# Patient Record
Sex: Male | Born: 1950 | Race: Asian | Hispanic: No | Marital: Married | Smoking: Former smoker
Health system: Southern US, Community
[De-identification: ages and names within clinical notes are randomized; demographics above are authoritative.]

## PROBLEM LIST (undated history)

## (undated) DIAGNOSIS — I10 Essential (primary) hypertension: Secondary | ICD-10-CM

## (undated) DIAGNOSIS — H409 Unspecified glaucoma: Secondary | ICD-10-CM

## (undated) HISTORY — DX: Unspecified glaucoma: H40.9

## (undated) HISTORY — DX: Essential (primary) hypertension: I10

---

## 1998-07-13 ENCOUNTER — Ambulatory Visit (HOSPITAL_COMMUNITY): Admission: RE | Admit: 1998-07-13 | Discharge: 1998-07-13 | Payer: Self-pay | Admitting: Family Medicine

## 1998-07-13 ENCOUNTER — Encounter: Payer: Self-pay | Admitting: Family Medicine

## 2001-06-06 ENCOUNTER — Encounter (INDEPENDENT_AMBULATORY_CARE_PROVIDER_SITE_OTHER): Payer: Self-pay

## 2001-06-06 ENCOUNTER — Ambulatory Visit (HOSPITAL_COMMUNITY): Admission: RE | Admit: 2001-06-06 | Discharge: 2001-06-06 | Payer: Self-pay | Admitting: *Deleted

## 2004-05-04 ENCOUNTER — Encounter: Admission: RE | Admit: 2004-05-04 | Discharge: 2004-05-04 | Payer: Self-pay | Admitting: Internal Medicine

## 2005-05-17 ENCOUNTER — Ambulatory Visit (HOSPITAL_COMMUNITY): Admission: RE | Admit: 2005-05-17 | Discharge: 2005-05-17 | Payer: Self-pay | Admitting: *Deleted

## 2005-05-17 ENCOUNTER — Encounter (INDEPENDENT_AMBULATORY_CARE_PROVIDER_SITE_OTHER): Payer: Self-pay | Admitting: Specialist

## 2006-10-02 ENCOUNTER — Encounter (INDEPENDENT_AMBULATORY_CARE_PROVIDER_SITE_OTHER): Payer: Self-pay | Admitting: *Deleted

## 2006-10-02 ENCOUNTER — Ambulatory Visit (HOSPITAL_COMMUNITY): Admission: RE | Admit: 2006-10-02 | Discharge: 2006-10-02 | Payer: Self-pay | Admitting: *Deleted

## 2009-06-22 ENCOUNTER — Encounter: Admission: RE | Admit: 2009-06-22 | Discharge: 2009-06-22 | Payer: Self-pay | Admitting: Internal Medicine

## 2010-09-07 NOTE — Op Note (Signed)
[name], Roger Mejia                  ACCOUNT NO.:  [id number]   MEDICAL RECORD NO.:  [id number]          PATIENT TYPE:  AMB   LOCATION:  ENDO                         FACILITY:  [facility]   PHYSICIAN:  Georgiana Spinner, M.D.    DATE OF BIRTH:  13-Feb-1951   DATE OF PROCEDURE:  10/02/2006  DATE OF DISCHARGE:                               OPERATIVE REPORT   PROCEDURE:  Upper endoscopy.   INDICATIONS:  GERD with known Barrett esophagus.   ANESTHESIA:  Fentanyl 50 mcg and Versed 7 mg.   PROCEDURE:  With the patient mildly sedated in the left lateral  decubitus position, the Pentax videoscopic endoscope was inserted in the  mouth and passed under direct vision through the esophagus, which  appeared normal, until it reached the distal esophagus.  There was a  small area of Barrett's photographed and biopsies were taken.  From this  point, the endoscope was then advanced into the stomach.  The fundus,  body, antrum, duodenal bulb, and second portion of the duodenum were  visualized.  From this point, the endoscope was slowly withdrawn taking  circumferential views of the duodenal mucosa until the endoscope had  been pulled back into the stomach and placed in retroflexion to view the  stomach from below.  The endoscope was straightened and withdrawn taking  circumferential views of the remaining gastric and esophageal mucosa.  The patient's vital signs and pulse oximeter remained stable.  The  patient tolerated the procedure well without apparent complications.   FINDINGS:  Barrett esophagus biopsied.  Await biopsy report.  The  patient will call me for results.  He will follow up with me as an  outpatient.           ______________________________  Georgiana Spinner, M.D.     GMO/MEDQ  D:  10/02/2006  T:  10/02/2006  Job:  [id number]

## 2010-09-10 NOTE — Op Note (Signed)
NAMEWARIS, Roger Mejia                  ACCOUNT NO.:  [id number]   MEDICAL RECORD NO.:  [id number]          PATIENT TYPE:  AMB   LOCATION:  ENDO                         FACILITY:  [facility]   PHYSICIAN:  Georgiana Spinner, M.D.    DATE OF BIRTH:  1951/01/22   DATE OF PROCEDURE:  05/17/2005  DATE OF DISCHARGE:                                 OPERATIVE REPORT   PROCEDURE:  Colonoscopy.   INDICATION:  Colon polyp.   ANESTHESIA:  Demerol 45, Versed 2.5 milligram.   PROCEDURE:  With the patient mildly sedated in the left lateral decubitus  position the Olympus videoscopic colonoscope was inserted in the rectum  after normal rectal examination and under direct vision to the cecum  identified by ileocecal valve and appendiceal orifice, both of which were  photographed. The valve appeared __________ therefore I elected to do  biopsies.  I photographed diverticula seen in the cecum. From this point the  colonoscope was slowly withdrawn taking circumferential views of the  remaining colonic mucosa stopping at approximately 15 cm from the anal  verge.  At this point two small polyps were seen and photographed and  removed using snare cautery technique setting of 20/200 blended current. The  endoscope was then placed in retroflexion to view the anal canal from above.  Hemorrhoids were seen and photographed. The endoscope was straightened and  withdrawn. The patient's vital signs, pulse oximetry remained stable. The  patient tolerated the procedure well and __________ hemorrhoids,  diverticulosis of the cecum.  Polyps as described above __________ valve.   PLAN:  Await biopsy report.  The patient will call me for results and follow  up with me as an outpatient.           ______________________________  Georgiana Spinner, M.D.     GMO/MEDQ  D:  05/17/2005  T:  05/17/2005  Job:  [id number]

## 2010-09-10 NOTE — Op Note (Signed)
Roger Mejia, Roger Mejia                  ACCOUNT NO.:  [id number]   MEDICAL RECORD NO.:  [id number]          PATIENT TYPE:  AMB   LOCATION:  ENDO                         FACILITY:  [facility]   PHYSICIAN:  Georgiana Spinner, M.D.    DATE OF BIRTH:  [birth year]   DATE OF PROCEDURE:  05/17/2005  DATE OF DISCHARGE:                                 OPERATIVE REPORT   PROCEDURE:  Upper endoscopy with biopsy.   INDICATIONS:  GERD.   ANESTHESIA:  Demerol 30 mg, Versed 5 mg.   With the patient mildly sedated in the left lateral decubitus position, the  Olympus videoscopic endoscope was inserted in the mouth, advanced under  direct vision through the esophagus, which appeared normal until we reached  the distal esophagus, and there were changes of esophagitis and/or Barrett's  esophagus, both seen, photographs and biopsies were taken.  We entered into  the stomach.  Fundus and body, antrum, duodenal bulb and second portion of  the duodenum appeared normal.  From this point the endoscope was slowly  withdrawn taking circumferential views of the duodenal mucosa until the  endoscope had been pulled back in the stomach, placed in retroflexion to  view the stomach from below.  The scope was straightened and withdrawn,  taking circumferential views of the remaining gastric and esophageal mucosa.  The patient's vital signs and pulse oximetry remained stable.  The patient  tolerated the procedure well without apparent complications.   FINDINGS:  Esophagitis with possible Barrett's esophagus, biopsied.   Await biopsy report.  The patient will call me for results and follow up  with me as an outpatient.           ______________________________  Georgiana Spinner, M.D.     GMO/MEDQ  D:  05/17/2005  T:  05/17/2005  Job:  [id number]

## 2015-08-06 ENCOUNTER — Other Ambulatory Visit: Payer: Self-pay | Admitting: Internal Medicine

## 2015-08-06 DIAGNOSIS — R634 Abnormal weight loss: Secondary | ICD-10-CM

## 2015-08-09 ENCOUNTER — Encounter: Payer: Self-pay | Admitting: *Deleted

## 2015-08-09 DIAGNOSIS — F4321 Adjustment disorder with depressed mood: Secondary | ICD-10-CM

## 2015-08-09 NOTE — Congregational Nurse Program (Signed)
Congregational Nurse Program Note  Date of Encounter: 08/09/2015  Past Medical History: No past medical history on file.  Encounter Details:     CNP Questionnaire - 07/29/15 1500    Patient Demographics   Is this a new or existing patient? New   Patient is considered a/an Immigrant   Race Asian   Patient Assistance   Location of Patient Assistance Not Applicable   Patient's financial/insurance status Private Insurance Coverage   Uninsured Patient No   Patient referred to apply for the following financial assistance Not Applicable   Food insecurities addressed Not Applicable   Transportation assistance No   Assistance securing medications No   Educational health offerings Spiritual care   Encounter Details   Primary purpose of visit Spiritual Care/Support Visit   Was an Emergency Department visit averted? Not Applicable   Does patient have a medical provider? Yes   Patient referred to Not Applicable   Was a mental health screening completed? (GAINS tool) No   Does patient have dental issues? No   Does patient have vision issues? Yes  hx of glaucoma   Was a vision referral made? No   Since previous encounter, have you referred patient for abnormal blood pressure that resulted in a new diagnosis or medication change? No   Since previous encounter, have you referred patient for abnormal blood glucose that resulted in a new diagnosis or medication change? No   For Abstraction Use Only   Does patient have insurance? Yes     He recently lost one of dog, build up anxiety problems. Palpitation. Been check up with family Dr.  And cardiology . Stress test negative. On xanax for anxiety. Spiritual care given with his wife.

## 2015-08-17 ENCOUNTER — Other Ambulatory Visit: Payer: Self-pay

## 2015-10-06 DIAGNOSIS — H532 Diplopia: Secondary | ICD-10-CM | POA: Diagnosis not present

## 2015-10-06 DIAGNOSIS — H52223 Regular astigmatism, bilateral: Secondary | ICD-10-CM | POA: Diagnosis not present

## 2015-10-06 DIAGNOSIS — H524 Presbyopia: Secondary | ICD-10-CM | POA: Diagnosis not present

## 2015-10-06 DIAGNOSIS — H25813 Combined forms of age-related cataract, bilateral: Secondary | ICD-10-CM | POA: Diagnosis not present

## 2015-10-06 DIAGNOSIS — H04123 Dry eye syndrome of bilateral lacrimal glands: Secondary | ICD-10-CM | POA: Diagnosis not present

## 2015-10-06 DIAGNOSIS — H5213 Myopia, bilateral: Secondary | ICD-10-CM | POA: Diagnosis not present

## 2015-10-06 DIAGNOSIS — H401133 Primary open-angle glaucoma, bilateral, severe stage: Secondary | ICD-10-CM | POA: Diagnosis not present

## 2015-10-28 DIAGNOSIS — R21 Rash and other nonspecific skin eruption: Secondary | ICD-10-CM | POA: Diagnosis not present

## 2015-11-09 DIAGNOSIS — H538 Other visual disturbances: Secondary | ICD-10-CM | POA: Diagnosis not present

## 2015-11-09 DIAGNOSIS — H2510 Age-related nuclear cataract, unspecified eye: Secondary | ICD-10-CM | POA: Diagnosis not present

## 2015-11-09 DIAGNOSIS — H532 Diplopia: Secondary | ICD-10-CM | POA: Diagnosis not present

## 2015-11-09 DIAGNOSIS — H4089 Other specified glaucoma: Secondary | ICD-10-CM | POA: Diagnosis not present

## 2015-11-09 DIAGNOSIS — H53001 Unspecified amblyopia, right eye: Secondary | ICD-10-CM | POA: Diagnosis not present

## 2015-11-12 DIAGNOSIS — R21 Rash and other nonspecific skin eruption: Secondary | ICD-10-CM | POA: Diagnosis not present

## 2015-11-12 DIAGNOSIS — L0390 Cellulitis, unspecified: Secondary | ICD-10-CM | POA: Diagnosis not present

## 2015-12-29 DIAGNOSIS — I10 Essential (primary) hypertension: Secondary | ICD-10-CM | POA: Diagnosis not present

## 2015-12-29 DIAGNOSIS — R739 Hyperglycemia, unspecified: Secondary | ICD-10-CM | POA: Diagnosis not present

## 2016-01-04 DIAGNOSIS — R739 Hyperglycemia, unspecified: Secondary | ICD-10-CM | POA: Diagnosis not present

## 2016-01-04 DIAGNOSIS — E7800 Pure hypercholesterolemia, unspecified: Secondary | ICD-10-CM | POA: Diagnosis not present

## 2016-01-04 DIAGNOSIS — I10 Essential (primary) hypertension: Secondary | ICD-10-CM | POA: Diagnosis not present

## 2016-02-09 DIAGNOSIS — H25813 Combined forms of age-related cataract, bilateral: Secondary | ICD-10-CM | POA: Diagnosis not present

## 2016-02-09 DIAGNOSIS — H532 Diplopia: Secondary | ICD-10-CM | POA: Diagnosis not present

## 2016-02-09 DIAGNOSIS — H52223 Regular astigmatism, bilateral: Secondary | ICD-10-CM | POA: Diagnosis not present

## 2016-02-09 DIAGNOSIS — H401133 Primary open-angle glaucoma, bilateral, severe stage: Secondary | ICD-10-CM | POA: Diagnosis not present

## 2016-02-09 DIAGNOSIS — H524 Presbyopia: Secondary | ICD-10-CM | POA: Diagnosis not present

## 2016-02-09 DIAGNOSIS — H04123 Dry eye syndrome of bilateral lacrimal glands: Secondary | ICD-10-CM | POA: Diagnosis not present

## 2016-02-09 DIAGNOSIS — H5213 Myopia, bilateral: Secondary | ICD-10-CM | POA: Diagnosis not present

## 2016-05-25 DIAGNOSIS — I209 Angina pectoris, unspecified: Secondary | ICD-10-CM | POA: Diagnosis not present

## 2016-05-25 DIAGNOSIS — E7800 Pure hypercholesterolemia, unspecified: Secondary | ICD-10-CM | POA: Diagnosis not present

## 2016-05-25 DIAGNOSIS — I10 Essential (primary) hypertension: Secondary | ICD-10-CM | POA: Diagnosis not present

## 2016-05-27 DIAGNOSIS — I10 Essential (primary) hypertension: Secondary | ICD-10-CM | POA: Diagnosis not present

## 2016-05-27 DIAGNOSIS — I200 Unstable angina: Secondary | ICD-10-CM | POA: Diagnosis not present

## 2016-05-27 DIAGNOSIS — E785 Hyperlipidemia, unspecified: Secondary | ICD-10-CM | POA: Diagnosis not present

## 2016-06-01 ENCOUNTER — Telehealth: Payer: Self-pay | Admitting: *Deleted

## 2016-06-01 ENCOUNTER — Encounter: Payer: Self-pay | Admitting: *Deleted

## 2016-06-01 DIAGNOSIS — R079 Chest pain, unspecified: Secondary | ICD-10-CM | POA: Insufficient documentation

## 2016-06-01 DIAGNOSIS — E7800 Pure hypercholesterolemia, unspecified: Secondary | ICD-10-CM | POA: Diagnosis not present

## 2016-06-01 DIAGNOSIS — I10 Essential (primary) hypertension: Secondary | ICD-10-CM | POA: Diagnosis not present

## 2016-06-01 DIAGNOSIS — I209 Angina pectoris, unspecified: Secondary | ICD-10-CM | POA: Diagnosis not present

## 2016-06-01 NOTE — Congregational Nurse Program (Unsigned)
Congregational Nurse Program Note  Date of Encounter: 05/26/2016  Past Medical History: No past medical history on file.  Encounter Details:     CNP Questionnaire - 05/26/16 1400      Patient Demographics   Is this a new or existing patient? Existing   Patient is considered a/an Immigrant   Race Asian     Patient Assistance   Location of Patient Assistance Not Applicable  [facility]   Patient's financial/insurance status Private Insurance Coverage   Uninsured Patient ([place] Card/Care Connects) No   Patient referred to apply for the following financial assistance Not Applicable   Food insecurities addressed Not Applicable   Transportation assistance No   Assistance securing medications No   Educational health offerings Cardiac disease;Other  scheduled stress test tomorrow with medicine     Encounter Details   Primary purpose of visit Education/Health Concerns;Family/Caregiver Support;Spiritual Care/Support Visit   Was an Emergency Department visit averted? Not Applicable   Does patient have a medical provider? Yes   Patient referred to Other  been visited cardiology    Was a mental health screening completed? (GAINS tool) No   Does patient have dental issues? No   Does patient have vision issues? Yes  hx of glaucoma   Was a vision referral made? No  on eye drips   Does your patient have an abnormal blood pressure today? No   Since previous encounter, have you referred patient for abnormal blood pressure that resulted in a new diagnosis or medication change? No   Does your patient have an abnormal blood glucose today? No   Was there a life-saving intervention made? No     c/o chest pain episode x2, been visit Dr Einar Gip cardiology.EKG normal,  Scheduled pharmacological stress tess tomorrow. Rx received Nitroglycerin sublinguial prn, if need 2nd dose, call 911 by Dr. Einar Gip. Will f/u

## 2016-06-06 DIAGNOSIS — R0600 Dyspnea, unspecified: Secondary | ICD-10-CM | POA: Diagnosis not present

## 2016-06-09 ENCOUNTER — Ambulatory Visit (INDEPENDENT_AMBULATORY_CARE_PROVIDER_SITE_OTHER): Payer: Medicare Other | Admitting: Internal Medicine

## 2016-06-09 ENCOUNTER — Encounter: Payer: Self-pay | Admitting: Internal Medicine

## 2016-06-09 ENCOUNTER — Ambulatory Visit (INDEPENDENT_AMBULATORY_CARE_PROVIDER_SITE_OTHER)
Admission: RE | Admit: 2016-06-09 | Discharge: 2016-06-09 | Disposition: A | Discharge status: Home / Self Care | Payer: Medicare Other | Source: Ambulatory Visit | Attending: Internal Medicine | Admitting: Internal Medicine

## 2016-06-09 ENCOUNTER — Other Ambulatory Visit (INDEPENDENT_AMBULATORY_CARE_PROVIDER_SITE_OTHER): Payer: Medicare Other

## 2016-06-09 VITALS — BP 120/80 | HR 91 | Ht 71.0 in | Wt 184.4 lb

## 2016-06-09 DIAGNOSIS — R0602 Shortness of breath: Secondary | ICD-10-CM | POA: Diagnosis not present

## 2016-06-09 DIAGNOSIS — R0609 Other forms of dyspnea: Secondary | ICD-10-CM | POA: Diagnosis not present

## 2016-06-09 DIAGNOSIS — R079 Chest pain, unspecified: Secondary | ICD-10-CM | POA: Diagnosis not present

## 2016-06-09 LAB — CBC WITH DIFFERENTIAL/PLATELET
BASOS ABS: 0 10*3/uL (ref 0.0–0.1)
Basophils Relative: 0.9 % (ref 0.0–3.0)
EOS ABS: 0.2 10*3/uL (ref 0.0–0.7)
EOS PCT: 3.4 % (ref 0.0–5.0)
HCT: 43.9 % (ref 39.0–52.0)
HEMOGLOBIN: 15.1 g/dL (ref 13.0–17.0)
LYMPHS ABS: 2.3 10*3/uL (ref 0.7–4.0)
Lymphocytes Relative: 45.6 % (ref 12.0–46.0)
MCHC: 34.3 g/dL (ref 30.0–36.0)
MCV: 98.5 fl (ref 78.0–100.0)
MONO ABS: 0.5 10*3/uL (ref 0.1–1.0)
Monocytes Relative: 10.1 % (ref 3.0–12.0)
NEUTROS PCT: 40 % — AB (ref 43.0–77.0)
Neutro Abs: 2 10*3/uL (ref 1.4–7.7)
Platelets: 175 10*3/uL (ref 150.0–400.0)
RBC: 4.45 Mil/uL (ref 4.22–5.81)
RDW: 12.6 % (ref 11.5–15.5)
WBC: 5.1 10*3/uL (ref 4.0–10.5)

## 2016-06-09 LAB — HEPATIC FUNCTION PANEL
ALBUMIN: 4.4 g/dL (ref 3.5–5.2)
ALK PHOS: 51 U/L (ref 39–117)
ALT: 15 U/L (ref 0–53)
AST: 16 U/L (ref 0–37)
BILIRUBIN DIRECT: 0.1 mg/dL (ref 0.0–0.3)
BILIRUBIN TOTAL: 1 mg/dL (ref 0.2–1.2)
Total Protein: 6.9 g/dL (ref 6.0–8.3)

## 2016-06-09 LAB — BASIC METABOLIC PANEL
BUN: 20 mg/dL (ref 6–23)
CALCIUM: 9.4 mg/dL (ref 8.4–10.5)
CO2: 24 meq/L (ref 19–32)
CREATININE: 0.91 mg/dL (ref 0.40–1.50)
Chloride: 110 mEq/L (ref 96–112)
GFR: 88.7 mL/min (ref >60.00)
GLUCOSE: 103 mg/dL — AB (ref 70–99)
Potassium: 4.2 mEq/L (ref 3.5–5.1)
Sodium: 139 mEq/L (ref 135–145)

## 2016-06-09 LAB — TSH: TSH: 1.57 u[IU]/mL (ref 0.35–4.50)

## 2016-06-09 LAB — BRAIN NATRIURETIC PEPTIDE: Pro B Natriuretic peptide (BNP): 40 pg/mL (ref 0.0–100.0)

## 2016-06-09 NOTE — Progress Notes (Addendum)
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 06/04/15)     06/09/2016 1st [facility] office visit/ Tymesha Ditmore   Chief Complaint  Patient presents with  . Pulmonary Consult    chest pain for 3 weeks, comes and goes, SOB  onset sev years ago onset ago some cough / sob sometimes discolored and more doe eg walking up inclines when playing golf = MMRC1 = can walk nl pace, flat grade, can't hurry or go uphills or steps s sob    then cp  Acute onset 3 weeks prior to OV  Sporadic/ not related to ex shortest x 3-4 min and no longer than 7-8 min assoc with diaphoresis / located in center substernal s radiation or  pleuritic features  Pain is once or twice a day and every other night also assoc wakes up with it and is typically assoc with nausea and sweats  Saw Dr Maudie Mercury 3 days prior to OV  "did nothing"  - after mutiple back and forth turns out he started ppi ac daily and ordered ct abd Pt very evasive with questions related to symptoms/ meds / "maybe it's my nerves" when asked why this   No obvious day to day or daytime patterns to  variability or assoc excess/ purulent sputum or mucus plugs or hemoptysis or   chest tightness, subjective wheeze or overt sinus or hb symptoms. No unusual exp hx or h/o childhood pna/ asthma or knowledge of premature birth.  Sleeping ok most nights without nocturnal  or early am exacerbation  of respiratory  c/o's or need for noct saba. Also denies any obvious fluctuation of symptoms with weather or environmental changes or other aggravating or alleviating factors except as outlined above   Current Medications, Allergies, Complete Past Medical History,  Past Surgical History, Family History, and Social History were reviewed in [employer] record.       Review of Systems  Constitutional: Negative for fever and unexpected weight change.  HENT: Negative for congestion, dental problem, ear pain, nosebleeds, postnasal drip, rhinorrhea, sinus pressure, sneezing, sore throat and trouble swallowing.   Eyes: Negative for redness and itching.  Respiratory: Positive for cough and shortness of breath. Negative for chest tightness and wheezing.   Cardiovascular: Negative for palpitations and leg swelling.  Gastrointestinal: Negative for nausea and vomiting.  Genitourinary: Negative for dysuria.  Musculoskeletal: Negative for joint swelling.  Skin: Negative for rash.  Neurological: Negative for headaches.  Hematological: Does not bruise/bleed easily.  Psychiatric/Behavioral: Negative for dysphoric mood. The patient is not nervous/anxious.        Objective:   Physical Exam   amb [place] male nad very difficult historian confused with names of meds / only disclosed cozar until very end of the ov   Wt Readings from Last 3 Encounters:  06/09/16 184 lb 6.4 oz (83.6 kg)     Vital signs reviewed   - Note on arrival 02 sats  97% on RA     HEENT: nl dentition, turbinates bilaterally, and oropharynx. Nl external ear canals without cough reflex  NECK :  without JVD/Nodes/TM/ nl carotid upstrokes bilaterally   LUNGS: no acc muscle use,  Nl contour chest which is clear to A and P bilaterally without cough on insp or exp maneuvers   CV:  RRR  no s3 or murmur or increase in P2, and no edema   ABD:  soft and nontender with nl inspiratory excursion in the supine position. No bruits or organomegaly appreciated, bowel sounds nl  MS:  Nl gait/ ext warm without deformities, calf tenderness, cyanosis or clubbing No obvious joint restrictions   SKIN: warm and dry without lesions    NEURO:  alert, approp, nl sensorium with  no  motor or cerebellar deficits apparent.    CXR PA and Lateral:   06/09/2016 :    I personally reviewed images and agree with radiology impression as follows:    mild T scoliosis/ otherwise wnl    Labs ordered/ reviewed:      Chemistry      Component Value Date/Time   NA 139 06/09/2016 1712   K 4.2 06/09/2016 1712   CL 110 06/09/2016 1712   CO2 24 06/09/2016 1712   BUN 20 06/09/2016 1712   CREATININE 0.91 06/09/2016 1712      Component Value Date/Time   CALCIUM 9.4 06/09/2016 1712   ALKPHOS 51 06/09/2016 1712   AST 16 06/09/2016 1712   ALT 15 06/09/2016 1712   BILITOT 1.0 06/09/2016 1712        Lab Results  Component Value Date   WBC 5.1 06/09/2016   HGB 15.1 06/09/2016   HCT 43.9 06/09/2016   MCV 98.5 06/09/2016   PLT 175.0 06/09/2016     No results found for: [facility]  Pending    Lab Results  Component Value Date   TSH 1.57 06/09/2016     Lab Results  Component Value Date   PROBNP 40.0 06/09/2016              Assessment & Plan:

## 2016-06-09 NOTE — Patient Instructions (Addendum)
Take acid pill Take 30- 60 min before your first and last meals of the day   GERD (REFLUX)  is an extremely common cause of respiratory symptoms just like yours , many times with no obvious heartburn at all.    It can be treated with medication, but also with lifestyle changes including elevation of the head of your bed (ideally with 6 inch  bed blocks),  Smoking cessation, avoidance of late meals, excessive alcohol, and avoid fatty foods, chocolate, peppermint, colas, red wine, and acidic juices such as orange juice.  NO MINT OR MENTHOL PRODUCTS SO NO COUGH DROPS   USE SUGARLESS CANDY INSTEAD (Jolley ranchers or Stover's or Life Savers) or even ice chips will also do - the key is to swallow to prevent all throat clearing. NO OIL BASED VITAMINS - use powdered substitutes.    Please remember to go to the lab and x-ray department downstairs in the basement  for your tests - we will call you with the results when they are available.  We will arrange for an Abdominal ultrasound to look at your aorta and gallbladder tomorrow  Please schedule a follow up office visit in 2  weeks, sooner if needed with all active medications in hand

## 2016-06-10 ENCOUNTER — Telehealth: Payer: Self-pay | Admitting: Internal Medicine

## 2016-06-10 ENCOUNTER — Encounter: Payer: Self-pay | Admitting: Internal Medicine

## 2016-06-10 DIAGNOSIS — R109 Unspecified abdominal pain: Secondary | ICD-10-CM | POA: Insufficient documentation

## 2016-06-10 LAB — D-DIMER, QUANTITATIVE: D-Dimer, Quant: 0.36 mcg/mL FEU (ref <0.50)

## 2016-06-10 NOTE — Assessment & Plan Note (Signed)
Onset end of Jan 2018 with neg pharm stress test 06/03/16 (records requested)   Pain per his hx is episodic, not related to ex but with nausea and diaph so if not cardiac then GI top of the list and rec max rx for gerd and check abd [place] for gb/ pancreas/aorta   Clearly not a pulmonary source/ expressed I was very concerned about this type of pain not being dx'd and although it could very well turn out to be anxiety, which is what the pt thinks it is, it would be dx of exclusion

## 2016-06-10 NOTE — Progress Notes (Signed)
Spoke with pt and notified of results per Dr. Wert. Pt verbalized understanding and denied any questions. 

## 2016-06-10 NOTE — Progress Notes (Signed)
Already LMTCB

## 2016-06-10 NOTE — Telephone Encounter (Signed)
Spoke with the pt and notified of results and recs per MW (SEE RESULT NOTES) [place] ABD ordered

## 2016-06-10 NOTE — Progress Notes (Signed)
lmtcb

## 2016-06-10 NOTE — Assessment & Plan Note (Addendum)
Most likely he has mild copd and will need spirometry to confirm before considering any form of treatment especially until we sort out what's causing his chest pains     I reviewed the Fletcher curve with the patient that basically indicates  if you quit smoking when your best day FEV1 is still well preserved (as is most likely still  the case here)  it is highly unlikely you will progress to severe disease and informed the patient there was  no medication on the market that has proven to alter the curve/ its downward trajectory  or the likelihood of progression of their disease(unlike other chronic medical conditions such as atheroclerosis where we do think we can change the natural hx with risk reducing meds)    Therefore stopping smoking and maintaining abstinence is the most important aspect of care, not choice of inhalers or for that matter, doctors.    Will do spirometry on return and in meantime strongly encouraged not to smoke   Total time devoted to counseling  > 50 % of initial 60 min office visit:  review case with pt/wife  discussion of options/alternatives/ personally creating written customized instructions  in presence of pt  then going over those specific  Instructions directly with the pt including how to use all of the meds but in particular covering each new medication in detail and the difference between the maintenance= "automatic" meds and the prns using an action plan format for the latter (If this problem/symptom => do that organization reading Left to right).  Please see AVS from this visit for a full list of these instructions which I personally wrote for this pt and  are unique to this visit.

## 2016-06-14 DIAGNOSIS — H532 Diplopia: Secondary | ICD-10-CM | POA: Diagnosis not present

## 2016-06-14 DIAGNOSIS — H25813 Combined forms of age-related cataract, bilateral: Secondary | ICD-10-CM | POA: Diagnosis not present

## 2016-06-14 DIAGNOSIS — H401133 Primary open-angle glaucoma, bilateral, severe stage: Secondary | ICD-10-CM | POA: Diagnosis not present

## 2016-06-14 DIAGNOSIS — H04123 Dry eye syndrome of bilateral lacrimal glands: Secondary | ICD-10-CM | POA: Diagnosis not present

## 2016-06-20 ENCOUNTER — Ambulatory Visit
Admission: RE | Admit: 2016-06-20 | Discharge: 2016-06-20 | Disposition: A | Discharge status: Home / Self Care | Payer: Medicare Other | Source: Ambulatory Visit | Attending: Internal Medicine | Admitting: Internal Medicine

## 2016-06-20 DIAGNOSIS — R109 Unspecified abdominal pain: Secondary | ICD-10-CM | POA: Diagnosis not present

## 2016-06-20 DIAGNOSIS — R1084 Generalized abdominal pain: Secondary | ICD-10-CM | POA: Diagnosis not present

## 2016-06-21 NOTE — Progress Notes (Signed)
Spoke with pt and notified of results per Dr. Wert. Pt verbalized understanding and denied any questions. 

## 2016-06-27 ENCOUNTER — Encounter: Payer: Self-pay | Admitting: Internal Medicine

## 2016-06-27 ENCOUNTER — Ambulatory Visit (INDEPENDENT_AMBULATORY_CARE_PROVIDER_SITE_OTHER): Payer: Medicare Other | Admitting: Internal Medicine

## 2016-06-27 VITALS — BP 106/70 | HR 90 | Ht 71.0 in | Wt 185.6 lb

## 2016-06-27 DIAGNOSIS — R1013 Epigastric pain: Secondary | ICD-10-CM

## 2016-06-27 DIAGNOSIS — R0609 Other forms of dyspnea: Secondary | ICD-10-CM

## 2016-06-27 NOTE — Progress Notes (Signed)
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 06/04/15)      History of Present Illness  06/09/2016 1st [place] [facility] office visit/ Letetia Romanello   Chief Complaint  Patient presents with  . Pulmonary Consult    chest pain for 3 weeks, comes and goes, SOB  onset sev years ago onset ago some cough / sob sometimes discolored and more doe eg walking up inclines when playing golf = MMRC1 = can walk nl pace, flat grade, can't hurry or go uphills or steps s sob    then cp  Acute onset 3 weeks prior to OV  Sporadic/ not related to ex shortest x 3-4 min and no longer than 7-8 min assoc with diaphoresis / located in center substernal s radiation or  pleuritic features  Pain is once or twice a day and every other night also assoc wakes up with it and is typically assoc with nausea and sweats  Saw Dr Maudie Mercury 3 days prior to OV  "did nothing"  - after mutiple back and forth turns out he started ppi ac daily and ordered ct abd rec Take acid pill Take 30- 60 min before your first and last meals of the day  GERD  Diet   Please remember to go to the lab and x-ray department downstairs in the basement  for your tests - we will call you with the results when they are available. We will arrange for an Abdominal ultrasound to look at your aorta and gallbladder> ok    06/27/2016  f/u ov/Francenia Chimenti re: unexplained cp/ sob not related  Chief Complaint  Patient presents with  . Follow-up    CP has improved some but not resolved.   cp  continues mostly at rest, ? Some better on ppi bid ac, never noct, lasting secs to minutes / not brought on by ex which continue to  be at at level = MMRC1 = can walk nl pace, flat grade, can't hurry or go uphills or steps s sob    No obvious day to day or daytime variability or assoc excess/ purulent sputum or mucus plugs or hemoptysis or    subjective wheeze or overt sinus or hb symptoms. No unusual exp hx or h/o childhood pna/ asthma or knowledge of premature birth.  Sleeping ok without nocturnal  or early am exacerbation  of respiratory  c/o's or need for noct saba. Also denies any obvious fluctuation of symptoms with weather or environmental changes or other aggravating or alleviating factors except as outlined above   Current Medications, Allergies, Complete Past Medical History, Past Surgical History, Family History, and Social History were reviewed in [employer] record.  ROS  The following are not active complaints unless bolded sore throat, dysphagia, dental problems, itching, sneezing,  nasal congestion or excess/ purulent secretions, ear ache,   fever, chills, sweats, unintended wt loss, classically pleuritic or exertional cp,  orthopnea pnd or leg swelling, presyncope, palpitations, abdominal pain, anorexia, nausea, vomiting, diarrhea  or change in bowel or bladder habits,  change in stools or urine, dysuria,hematuria,  rash, arthralgias, visual complaints, headache, numbness, weakness or ataxia or problems with walking or coordination,  change in mood/affect or memory.           Objective:   Physical Exam   amb very anxious  [place] male nad   Wt Readings from Last 3 Encounters:  06/27/16 185 lb 9.6 oz (84.2 kg)  06/09/16 184 lb 6.4 oz (83.6 kg)    Vital signs reviewed   - Note on arrival 02 sats  97% on RA     HEENT: nl dentition, turbinates bilaterally, and oropharynx. Nl external ear canals without cough reflex   NECK :  without JVD/Nodes/TM/ nl carotid upstrokes bilaterally   LUNGS: no acc muscle use,  Nl contour chest which is clear to A and P bilaterally without cough on  insp or exp maneuvers   CV:  RRR  no s3 or murmur or increase in P2, and no edema   ABD:  soft and nontender with nl inspiratory excursion in the supine position. No bruits or organomegaly appreciated, bowel sounds nl  MS:  Nl gait/ ext warm without deformities, calf tenderness, cyanosis or clubbing No obvious joint restrictions   SKIN: warm and dry without lesions    NEURO:  alert, approp, nl sensorium with  no motor or cerebellar deficits apparent.    CXR PA and Lateral:   06/09/2016 :    I personally reviewed images and agree with radiology impression as follows:    mild T scoliosis/ otherwise wnl    Labs  reviewed:    Chemistry      Component Value Date/Time   NA 139 06/09/2016 1712   K 4.2 06/09/2016 1712   CL 110 06/09/2016 1712   CO2 24 06/09/2016 1712   BUN 20 06/09/2016 1712   CREATININE 0.91 06/09/2016 1712      Component Value Date/Time   CALCIUM 9.4 06/09/2016 1712   ALKPHOS 51 06/09/2016 1712   AST 16 06/09/2016 1712   ALT 15 06/09/2016 1712   BILITOT 1.0 06/09/2016 1712        Lab Results  Component Value Date   WBC 5.1 06/09/2016   HGB 15.1 06/09/2016   HCT 43.9 06/09/2016   MCV 98.5 06/09/2016   PLT 175.0 06/09/2016          Lab Results  Component Value Date   TSH 1.57 06/09/2016     Lab Results  Component Value Date   PROBNP 40.0 06/09/2016       Lab Results  Component Value Date   DDIMER 0.36 06/09/2016           Assessment & Plan:

## 2016-06-27 NOTE — Assessment & Plan Note (Signed)
U/s abd 06/20/2016  Nl abd/ pancreas not vz due to gas - still having midline lower chest/ upper abd pain sitting on ppi bid ac as of 06/27/2016 > rec trial of citrucel/ gas diet > gi f/u prn per PCP  see avs for instructions unique to this ov

## 2016-06-27 NOTE — Assessment & Plan Note (Addendum)
06/27/2016  Walked RA x 3 laps @ 185 ft each stopped due to  End of study, very fast pace, min sob/ no desats - Spirometry 06/27/2016  wnl with minimal curvature    I had an extended final summary discussion with the patient reviewing all relevant studies completed to date and  lasting 15 to 20 minutes of a 25 minute visit on the following issues:   1)   I reviewed the Fletcher curve with the patient that basically indicates  if you quit smoking when your best day FEV1 is still well preserved (as is clearly  the case here)  it is highly unlikely you will progress to severe disease and informed the patient there was  no medication on the market that has proven to alter the curve/ its downward trajectory  or the likelihood of progression of their disease(unlike other chronic medical conditions such as atheroclerosis where we do think we can change the natural hx with risk reducing meds)    Therefore stopping smoking and maintaining abstinence is the most important aspect of care, not choice of inhalers or for that matter, doctors.   - no role for pulmonary meds or f/u.   2)  As a corollary rule: When respiratory symptoms begin or become refractory well after a patient reports complete smoking cessation,  Especially when this wasn't the case while they were smoking, a red flag is raised based on the work of Dr Kris Mouton which states:  if you quit smoking when your best day FEV1 is still well preserved (as his is now)  it is highly unlikely you will progress to severe disease.  That is to say, once the smoking stops,  the symptoms should not suddenly erupt or markedly worsen.  If so, the differential diagnosis should include  obesity/deconditioning,  LPR/Reflux/Aspiration syndromes,  occult CHF, or  especially side effect of medications commonly used in this population.    3) anxiety is probably driving most of his symptoms and next step is cpst p he tries reconditioning x 2 weeks  4) pulmonary f/u is prn

## 2016-06-27 NOTE — Patient Instructions (Addendum)
Treatment consists of avoiding foods that cause gas (especially [place] food, boiled eggs,  beans and raw or undercooked vegetables like spinach and salads)  and  CITRUCEL 1 heaping tsp twice daily with a large glass of water.  Pain should improve w/in 2 weeks and if not additional GI evaluation may be needed     To get the most out of exercise, you need to be continuously aware that you are short of breath, but never out of breath, for 30 minutes daily. As you improve, it will actually be easier for you to do the same amount of exercise  in  30 minutes so always push to the level where you are short of breath.     If not convinced you are improving with regular exercise, next step is call [facility] at [phone] to arrange CPST     Pulmonary follow up is as needed

## 2016-07-25 DIAGNOSIS — H401123 Primary open-angle glaucoma, left eye, severe stage: Secondary | ICD-10-CM | POA: Diagnosis not present

## 2016-07-25 DIAGNOSIS — H401111 Primary open-angle glaucoma, right eye, mild stage: Secondary | ICD-10-CM | POA: Diagnosis not present

## 2016-07-25 DIAGNOSIS — H25813 Combined forms of age-related cataract, bilateral: Secondary | ICD-10-CM | POA: Diagnosis not present

## 2016-07-27 ENCOUNTER — Encounter: Payer: Self-pay | Admitting: Internal Medicine

## 2016-07-27 DIAGNOSIS — K219 Gastro-esophageal reflux disease without esophagitis: Secondary | ICD-10-CM | POA: Diagnosis not present

## 2016-07-27 DIAGNOSIS — R079 Chest pain, unspecified: Secondary | ICD-10-CM | POA: Diagnosis not present

## 2016-07-27 DIAGNOSIS — K2270 Barrett's esophagus without dysplasia: Secondary | ICD-10-CM | POA: Diagnosis not present

## 2016-07-28 ENCOUNTER — Other Ambulatory Visit: Payer: Self-pay | Admitting: Gastroenterology

## 2016-07-28 DIAGNOSIS — R079 Chest pain, unspecified: Secondary | ICD-10-CM

## 2016-08-03 ENCOUNTER — Ambulatory Visit
Admission: RE | Admit: 2016-08-03 | Discharge: 2016-08-03 | Disposition: A | Discharge status: Home / Self Care | Payer: Medicare Other | Source: Ambulatory Visit | Attending: Gastroenterology | Admitting: Gastroenterology

## 2016-08-03 DIAGNOSIS — R079 Chest pain, unspecified: Secondary | ICD-10-CM

## 2016-08-03 MED ORDER — TECHNETIUM TC 99M MEBROFENIN IV KIT
5.1600 | PACK | Freq: Once | INTRAVENOUS | Status: AC | PRN
  Administered 2016-08-03: 5.16 via INTRAVENOUS

## 2016-08-09 DIAGNOSIS — R0781 Pleurodynia: Secondary | ICD-10-CM | POA: Diagnosis not present

## 2016-08-09 DIAGNOSIS — E782 Mixed hyperlipidemia: Secondary | ICD-10-CM | POA: Diagnosis not present

## 2016-08-09 DIAGNOSIS — I10 Essential (primary) hypertension: Secondary | ICD-10-CM | POA: Diagnosis not present

## 2016-08-09 DIAGNOSIS — K219 Gastro-esophageal reflux disease without esophagitis: Secondary | ICD-10-CM | POA: Diagnosis not present

## 2016-08-09 DIAGNOSIS — R7301 Impaired fasting glucose: Secondary | ICD-10-CM | POA: Diagnosis not present

## 2016-08-12 DIAGNOSIS — E782 Mixed hyperlipidemia: Secondary | ICD-10-CM | POA: Diagnosis not present

## 2016-08-12 DIAGNOSIS — R7301 Impaired fasting glucose: Secondary | ICD-10-CM | POA: Diagnosis not present

## 2016-08-12 DIAGNOSIS — I10 Essential (primary) hypertension: Secondary | ICD-10-CM | POA: Diagnosis not present

## 2016-08-16 ENCOUNTER — Telehealth: Payer: Self-pay | Admitting: *Deleted

## 2016-08-16 ENCOUNTER — Encounter: Payer: Self-pay | Admitting: *Deleted

## 2016-08-16 NOTE — Congregational Nurse Program (Signed)
Congregational Nurse Program Note  Date of Encounter: 08/02/2016  Past Medical History: Past Medical History:  Diagnosis Date  . Glaucoma   . Hypertension     Encounter Details:     CNP Questionnaire - 08/02/16 2000      Patient Demographics   Is this a new or existing patient? Existing   Patient is considered a/an Immigrant   Race Asian     Patient Assistance   Location of Patient Assistance Not Applicable   Patient's financial/insurance status Private Insurance Coverage   Uninsured Patient ([place] [employer]s) No   Patient referred to apply for the following financial assistance Not Applicable   Food insecurities addressed Not Applicable   Transportation assistance No   Assistance securing medications No   Educational health offerings Other  refered to go to ER     Encounter Details   Primary purpose of visit Other   Was an Emergency Department visit averted? No  pt refused to go to ER   Does patient have a medical provider? Yes   Patient referred to Emergency Department;Other  pt refused    Was a mental health screening completed? (GAINS tool) No   Does patient have dental issues? No   Does patient have vision issues? No   Does your patient have an abnormal blood pressure today? No  not checked   Since previous encounter, have you referred patient for abnormal blood pressure that resulted in a new diagnosis or medication change? No   Does your patient have an abnormal blood glucose today? No   Since previous encounter, have you referred patient for abnormal blood glucose that resulted in a new diagnosis or medication change? No   Was there a life-saving intervention made? No      pt had several episode of chest and Back pain. Also kind of panic attack. Wife went to mission trip, same pain on the chest and back burning. Referred to go to ER, but he refused. Spiritual care offered. Will f/u

## 2016-08-23 ENCOUNTER — Ambulatory Visit: Payer: Medicare Other | Admitting: Internal Medicine

## 2016-08-23 DIAGNOSIS — K3189 Other diseases of stomach and duodenum: Secondary | ICD-10-CM | POA: Diagnosis not present

## 2016-08-23 DIAGNOSIS — K449 Diaphragmatic hernia without obstruction or gangrene: Secondary | ICD-10-CM | POA: Diagnosis not present

## 2016-08-23 DIAGNOSIS — K208 Other esophagitis: Secondary | ICD-10-CM | POA: Diagnosis not present

## 2016-08-23 DIAGNOSIS — K219 Gastro-esophageal reflux disease without esophagitis: Secondary | ICD-10-CM | POA: Diagnosis not present

## 2016-08-23 DIAGNOSIS — K319 Disease of stomach and duodenum, unspecified: Secondary | ICD-10-CM | POA: Diagnosis not present

## 2016-08-23 DIAGNOSIS — K2950 Unspecified chronic gastritis without bleeding: Secondary | ICD-10-CM | POA: Diagnosis not present

## 2016-08-23 DIAGNOSIS — K210 Gastro-esophageal reflux disease with esophagitis: Secondary | ICD-10-CM | POA: Diagnosis not present

## 2016-08-23 DIAGNOSIS — K2970 Gastritis, unspecified, without bleeding: Secondary | ICD-10-CM | POA: Diagnosis not present

## 2016-08-23 DIAGNOSIS — K2990 Gastroduodenitis, unspecified, without bleeding: Secondary | ICD-10-CM | POA: Diagnosis not present

## 2016-08-23 DIAGNOSIS — R12 Heartburn: Secondary | ICD-10-CM | POA: Diagnosis not present

## 2016-09-27 DIAGNOSIS — H401123 Primary open-angle glaucoma, left eye, severe stage: Secondary | ICD-10-CM | POA: Diagnosis not present

## 2016-10-03 DIAGNOSIS — K219 Gastro-esophageal reflux disease without esophagitis: Secondary | ICD-10-CM | POA: Diagnosis not present

## 2016-10-03 DIAGNOSIS — K449 Diaphragmatic hernia without obstruction or gangrene: Secondary | ICD-10-CM | POA: Diagnosis not present

## 2016-10-03 DIAGNOSIS — K208 Other esophagitis: Secondary | ICD-10-CM | POA: Diagnosis not present

## 2016-10-19 DIAGNOSIS — R011 Cardiac murmur, unspecified: Secondary | ICD-10-CM | POA: Diagnosis not present

## 2016-10-31 DIAGNOSIS — H401123 Primary open-angle glaucoma, left eye, severe stage: Secondary | ICD-10-CM | POA: Diagnosis not present

## 2016-10-31 DIAGNOSIS — H401111 Primary open-angle glaucoma, right eye, mild stage: Secondary | ICD-10-CM | POA: Diagnosis not present

## 2017-02-06 DIAGNOSIS — Z23 Encounter for immunization: Secondary | ICD-10-CM | POA: Diagnosis not present

## 2017-03-21 DIAGNOSIS — H532 Diplopia: Secondary | ICD-10-CM | POA: Diagnosis not present

## 2017-03-21 DIAGNOSIS — H401133 Primary open-angle glaucoma, bilateral, severe stage: Secondary | ICD-10-CM | POA: Diagnosis not present

## 2017-03-21 DIAGNOSIS — H25813 Combined forms of age-related cataract, bilateral: Secondary | ICD-10-CM | POA: Diagnosis not present

## 2017-03-21 DIAGNOSIS — H04123 Dry eye syndrome of bilateral lacrimal glands: Secondary | ICD-10-CM | POA: Diagnosis not present

## 2017-04-11 DIAGNOSIS — H401133 Primary open-angle glaucoma, bilateral, severe stage: Secondary | ICD-10-CM | POA: Diagnosis not present

## 2017-04-11 DIAGNOSIS — H532 Diplopia: Secondary | ICD-10-CM | POA: Diagnosis not present

## 2017-04-11 DIAGNOSIS — H52223 Regular astigmatism, bilateral: Secondary | ICD-10-CM | POA: Diagnosis not present

## 2017-04-11 DIAGNOSIS — H5213 Myopia, bilateral: Secondary | ICD-10-CM | POA: Diagnosis not present

## 2017-04-11 DIAGNOSIS — H04123 Dry eye syndrome of bilateral lacrimal glands: Secondary | ICD-10-CM | POA: Diagnosis not present

## 2017-04-11 DIAGNOSIS — H524 Presbyopia: Secondary | ICD-10-CM | POA: Diagnosis not present

## 2017-04-11 DIAGNOSIS — H25813 Combined forms of age-related cataract, bilateral: Secondary | ICD-10-CM | POA: Diagnosis not present

## 2017-05-24 ENCOUNTER — Other Ambulatory Visit: Payer: Self-pay | Admitting: Gastroenterology

## 2017-05-24 DIAGNOSIS — R1011 Right upper quadrant pain: Secondary | ICD-10-CM | POA: Diagnosis not present

## 2017-05-24 DIAGNOSIS — R1012 Left upper quadrant pain: Secondary | ICD-10-CM | POA: Diagnosis not present

## 2017-05-24 DIAGNOSIS — R1013 Epigastric pain: Secondary | ICD-10-CM | POA: Diagnosis not present

## 2017-05-24 DIAGNOSIS — R1084 Generalized abdominal pain: Secondary | ICD-10-CM

## 2017-05-25 ENCOUNTER — Encounter: Payer: Self-pay | Admitting: *Deleted

## 2017-05-26 ENCOUNTER — Ambulatory Visit
Admission: RE | Admit: 2017-05-26 | Discharge: 2017-05-26 | Disposition: A | Discharge status: Home / Self Care | Payer: Medicare Other | Source: Ambulatory Visit | Attending: Gastroenterology | Admitting: Gastroenterology

## 2017-05-26 DIAGNOSIS — K5730 Diverticulosis of large intestine without perforation or abscess without bleeding: Secondary | ICD-10-CM | POA: Diagnosis not present

## 2017-05-26 DIAGNOSIS — R1084 Generalized abdominal pain: Secondary | ICD-10-CM

## 2017-05-26 MED ORDER — IOPAMIDOL (ISOVUE-300) INJECTION 61%
125.0000 mL | Freq: Once | INTRAVENOUS | Status: AC | PRN
  Administered 2017-05-26: 125 mL via INTRAVENOUS

## 2017-05-27 NOTE — Congregational Nurse Program (Unsigned)
Congregational Nurse Program Note  Date of Encounter: 05/25/2017  Past Medical History: Past Medical History:  Diagnosis Date  . Glaucoma   . Hypertension     Encounter Details: CNP Questionnaire - 05/25/17 1100      Questionnaire   Patient Status  Immigrant    Race  Asian    Location Patient Served At  Not Applicable [place]    Uninsured  Not Applicable    Food  No food insecurities    Housing/Utilities  Yes, have permanent housing    Transportation  No transportation needs    Interpersonal Safety  Yes, feel physically and emotionally safe where you currently live    Medication  No medication insecurities    Medical Provider  Yes    Referrals  Not Applicable    ED Visit Averted  Not Applicable    Life-Saving Intervention Made  Not Applicable       Wife visited office regarding pt's condition.  C/o ABD pain with diarrhea, said planned to CT abd on 05/26/2017 Suppose to drink 2 cups medicine  before test. explaine what was the contrast and how effect to the test, and it was important to take it. Will f/u the test resulr.

## 2017-06-19 DIAGNOSIS — R739 Hyperglycemia, unspecified: Secondary | ICD-10-CM | POA: Diagnosis not present

## 2017-06-19 DIAGNOSIS — I10 Essential (primary) hypertension: Secondary | ICD-10-CM | POA: Diagnosis not present

## 2017-06-26 DIAGNOSIS — E7800 Pure hypercholesterolemia, unspecified: Secondary | ICD-10-CM | POA: Diagnosis not present

## 2017-06-26 DIAGNOSIS — M5442 Lumbago with sciatica, left side: Secondary | ICD-10-CM | POA: Diagnosis not present

## 2017-06-26 DIAGNOSIS — Z79899 Other long term (current) drug therapy: Secondary | ICD-10-CM | POA: Diagnosis not present

## 2017-06-26 DIAGNOSIS — G8929 Other chronic pain: Secondary | ICD-10-CM | POA: Diagnosis not present

## 2017-06-26 DIAGNOSIS — I10 Essential (primary) hypertension: Secondary | ICD-10-CM | POA: Diagnosis not present

## 2017-06-26 DIAGNOSIS — R7309 Other abnormal glucose: Secondary | ICD-10-CM | POA: Diagnosis not present

## 2017-06-26 DIAGNOSIS — M542 Cervicalgia: Secondary | ICD-10-CM | POA: Diagnosis not present

## 2017-06-26 DIAGNOSIS — M545 Low back pain: Secondary | ICD-10-CM | POA: Diagnosis not present

## 2017-06-27 ENCOUNTER — Other Ambulatory Visit: Payer: Self-pay | Admitting: Internal Medicine

## 2017-06-27 DIAGNOSIS — M542 Cervicalgia: Secondary | ICD-10-CM

## 2017-06-27 DIAGNOSIS — M5442 Lumbago with sciatica, left side: Secondary | ICD-10-CM

## 2017-07-03 ENCOUNTER — Ambulatory Visit
Admission: RE | Admit: 2017-07-03 | Discharge: 2017-07-03 | Disposition: A | Discharge status: Home / Self Care | Payer: Medicare Other | Source: Ambulatory Visit | Attending: Internal Medicine | Admitting: Internal Medicine

## 2017-07-03 DIAGNOSIS — M48061 Spinal stenosis, lumbar region without neurogenic claudication: Secondary | ICD-10-CM | POA: Diagnosis not present

## 2017-07-03 DIAGNOSIS — M542 Cervicalgia: Secondary | ICD-10-CM

## 2017-07-03 DIAGNOSIS — M4802 Spinal stenosis, cervical region: Secondary | ICD-10-CM | POA: Diagnosis not present

## 2017-07-03 DIAGNOSIS — M5442 Lumbago with sciatica, left side: Secondary | ICD-10-CM

## 2017-07-09 ENCOUNTER — Encounter: Payer: Self-pay | Admitting: *Deleted

## 2017-07-10 NOTE — Congregational Nurse Program (Unsigned)
Congregational Nurse Program Note  Date of Encounter: 07/09/2017  Past Medical History: Past Medical History:  Diagnosis Date  . Glaucoma   . Hypertension     Encounter Details: CNP Questionnaire - 07/10/17 0620      Questionnaire   Patient Status  Immigrant    Race  Asian    Location Patient Served At  Not Applicable [place]    Uninsured  Not Applicable    Food  No food insecurities    Housing/Utilities  Yes, have permanent housing    Transportation  No transportation needs    Interpersonal Safety  Yes, feel physically and emotionally safe where you currently live    Medication  No medication insecurities    Medical Provider  Yes    Referrals  Not Applicable    ED Visit Averted  Not Applicable    Life-Saving Intervention Made  Not Applicable       Wife said Dr. Vonzella Nipple to take DM med. But wife told him not take instead  Encouraged to exercise. Explained that importance of control blood sugar lower to Normal range due to he has HTN also back pain. Wife told he will see neurosurgeon for back pain. Will f/u

## 2017-07-11 DIAGNOSIS — G5622 Lesion of ulnar nerve, left upper limb: Secondary | ICD-10-CM | POA: Diagnosis not present

## 2017-07-11 DIAGNOSIS — M47816 Spondylosis without myelopathy or radiculopathy, lumbar region: Secondary | ICD-10-CM | POA: Diagnosis not present

## 2017-07-11 DIAGNOSIS — M5416 Radiculopathy, lumbar region: Secondary | ICD-10-CM | POA: Diagnosis not present

## 2017-07-11 DIAGNOSIS — Z6826 Body mass index (BMI) 26.0-26.9, adult: Secondary | ICD-10-CM | POA: Diagnosis not present

## 2017-07-11 DIAGNOSIS — M549 Dorsalgia, unspecified: Secondary | ICD-10-CM | POA: Diagnosis not present

## 2017-07-11 DIAGNOSIS — M48062 Spinal stenosis, lumbar region with neurogenic claudication: Secondary | ICD-10-CM | POA: Diagnosis not present

## 2017-07-11 DIAGNOSIS — M5136 Other intervertebral disc degeneration, lumbar region: Secondary | ICD-10-CM | POA: Diagnosis not present

## 2017-07-11 DIAGNOSIS — I10 Essential (primary) hypertension: Secondary | ICD-10-CM | POA: Diagnosis not present

## 2017-07-11 DIAGNOSIS — M546 Pain in thoracic spine: Secondary | ICD-10-CM | POA: Diagnosis not present

## 2017-07-11 DIAGNOSIS — M4722 Other spondylosis with radiculopathy, cervical region: Secondary | ICD-10-CM | POA: Diagnosis not present

## 2017-07-11 DIAGNOSIS — M542 Cervicalgia: Secondary | ICD-10-CM | POA: Diagnosis not present

## 2017-07-11 DIAGNOSIS — M5030 Other cervical disc degeneration, unspecified cervical region: Secondary | ICD-10-CM | POA: Diagnosis not present

## 2017-07-13 DIAGNOSIS — M4726 Other spondylosis with radiculopathy, lumbar region: Secondary | ICD-10-CM | POA: Diagnosis not present

## 2017-07-13 DIAGNOSIS — M5136 Other intervertebral disc degeneration, lumbar region: Secondary | ICD-10-CM | POA: Diagnosis not present

## 2017-07-13 DIAGNOSIS — M48061 Spinal stenosis, lumbar region without neurogenic claudication: Secondary | ICD-10-CM | POA: Diagnosis not present

## 2017-07-24 DIAGNOSIS — M9981 Other biomechanical lesions of cervical region: Secondary | ICD-10-CM | POA: Diagnosis not present

## 2017-07-24 DIAGNOSIS — G5622 Lesion of ulnar nerve, left upper limb: Secondary | ICD-10-CM | POA: Diagnosis not present

## 2017-07-24 DIAGNOSIS — M5412 Radiculopathy, cervical region: Secondary | ICD-10-CM | POA: Diagnosis not present

## 2017-07-25 DIAGNOSIS — H04123 Dry eye syndrome of bilateral lacrimal glands: Secondary | ICD-10-CM | POA: Diagnosis not present

## 2017-07-25 DIAGNOSIS — H25813 Combined forms of age-related cataract, bilateral: Secondary | ICD-10-CM | POA: Diagnosis not present

## 2017-07-25 DIAGNOSIS — H532 Diplopia: Secondary | ICD-10-CM | POA: Diagnosis not present

## 2017-07-25 DIAGNOSIS — H524 Presbyopia: Secondary | ICD-10-CM | POA: Diagnosis not present

## 2017-08-07 DIAGNOSIS — H2513 Age-related nuclear cataract, bilateral: Secondary | ICD-10-CM | POA: Diagnosis not present

## 2017-08-07 DIAGNOSIS — H2512 Age-related nuclear cataract, left eye: Secondary | ICD-10-CM | POA: Diagnosis not present

## 2017-08-14 DIAGNOSIS — H2512 Age-related nuclear cataract, left eye: Secondary | ICD-10-CM | POA: Diagnosis not present

## 2017-08-14 DIAGNOSIS — H25812 Combined forms of age-related cataract, left eye: Secondary | ICD-10-CM | POA: Diagnosis not present

## 2017-08-15 DIAGNOSIS — H2511 Age-related nuclear cataract, right eye: Secondary | ICD-10-CM | POA: Diagnosis not present

## 2017-08-21 DIAGNOSIS — H2512 Age-related nuclear cataract, left eye: Secondary | ICD-10-CM | POA: Diagnosis not present

## 2017-08-21 DIAGNOSIS — H2511 Age-related nuclear cataract, right eye: Secondary | ICD-10-CM | POA: Diagnosis not present

## 2017-08-21 DIAGNOSIS — H25811 Combined forms of age-related cataract, right eye: Secondary | ICD-10-CM | POA: Diagnosis not present

## 2017-09-04 DIAGNOSIS — H4423 Degenerative myopia, bilateral: Secondary | ICD-10-CM | POA: Diagnosis not present

## 2017-09-04 DIAGNOSIS — H35433 Paving stone degeneration of retina, bilateral: Secondary | ICD-10-CM | POA: Diagnosis not present

## 2017-09-04 DIAGNOSIS — H35371 Puckering of macula, right eye: Secondary | ICD-10-CM | POA: Diagnosis not present

## 2017-09-04 DIAGNOSIS — H43813 Vitreous degeneration, bilateral: Secondary | ICD-10-CM | POA: Diagnosis not present

## 2017-09-20 DIAGNOSIS — E7800 Pure hypercholesterolemia, unspecified: Secondary | ICD-10-CM | POA: Diagnosis not present

## 2017-09-20 DIAGNOSIS — I10 Essential (primary) hypertension: Secondary | ICD-10-CM | POA: Diagnosis not present

## 2017-09-25 DIAGNOSIS — R739 Hyperglycemia, unspecified: Secondary | ICD-10-CM | POA: Diagnosis not present

## 2017-09-25 DIAGNOSIS — E7800 Pure hypercholesterolemia, unspecified: Secondary | ICD-10-CM | POA: Diagnosis not present

## 2017-09-25 DIAGNOSIS — R7309 Other abnormal glucose: Secondary | ICD-10-CM | POA: Diagnosis not present

## 2017-09-25 DIAGNOSIS — M542 Cervicalgia: Secondary | ICD-10-CM | POA: Diagnosis not present

## 2017-09-25 DIAGNOSIS — I10 Essential (primary) hypertension: Secondary | ICD-10-CM | POA: Diagnosis not present

## 2017-09-25 DIAGNOSIS — J301 Allergic rhinitis due to pollen: Secondary | ICD-10-CM | POA: Diagnosis not present

## 2017-09-25 DIAGNOSIS — R05 Cough: Secondary | ICD-10-CM | POA: Diagnosis not present

## 2017-10-13 DIAGNOSIS — H43393 Other vitreous opacities, bilateral: Secondary | ICD-10-CM | POA: Diagnosis not present

## 2017-10-13 DIAGNOSIS — H4423 Degenerative myopia, bilateral: Secondary | ICD-10-CM | POA: Diagnosis not present

## 2017-10-13 DIAGNOSIS — H35371 Puckering of macula, right eye: Secondary | ICD-10-CM | POA: Diagnosis not present

## 2017-10-13 DIAGNOSIS — H43813 Vitreous degeneration, bilateral: Secondary | ICD-10-CM | POA: Diagnosis not present

## 2017-10-17 IMAGING — DX DG CHEST 2V
2 series · 2 of 2 positions shown · non-contrast
Comparison: 07/21/2015

CLINICAL DATA: Chronic shortness of breath

EXAM:
CHEST  2 VIEW

[chest pa]
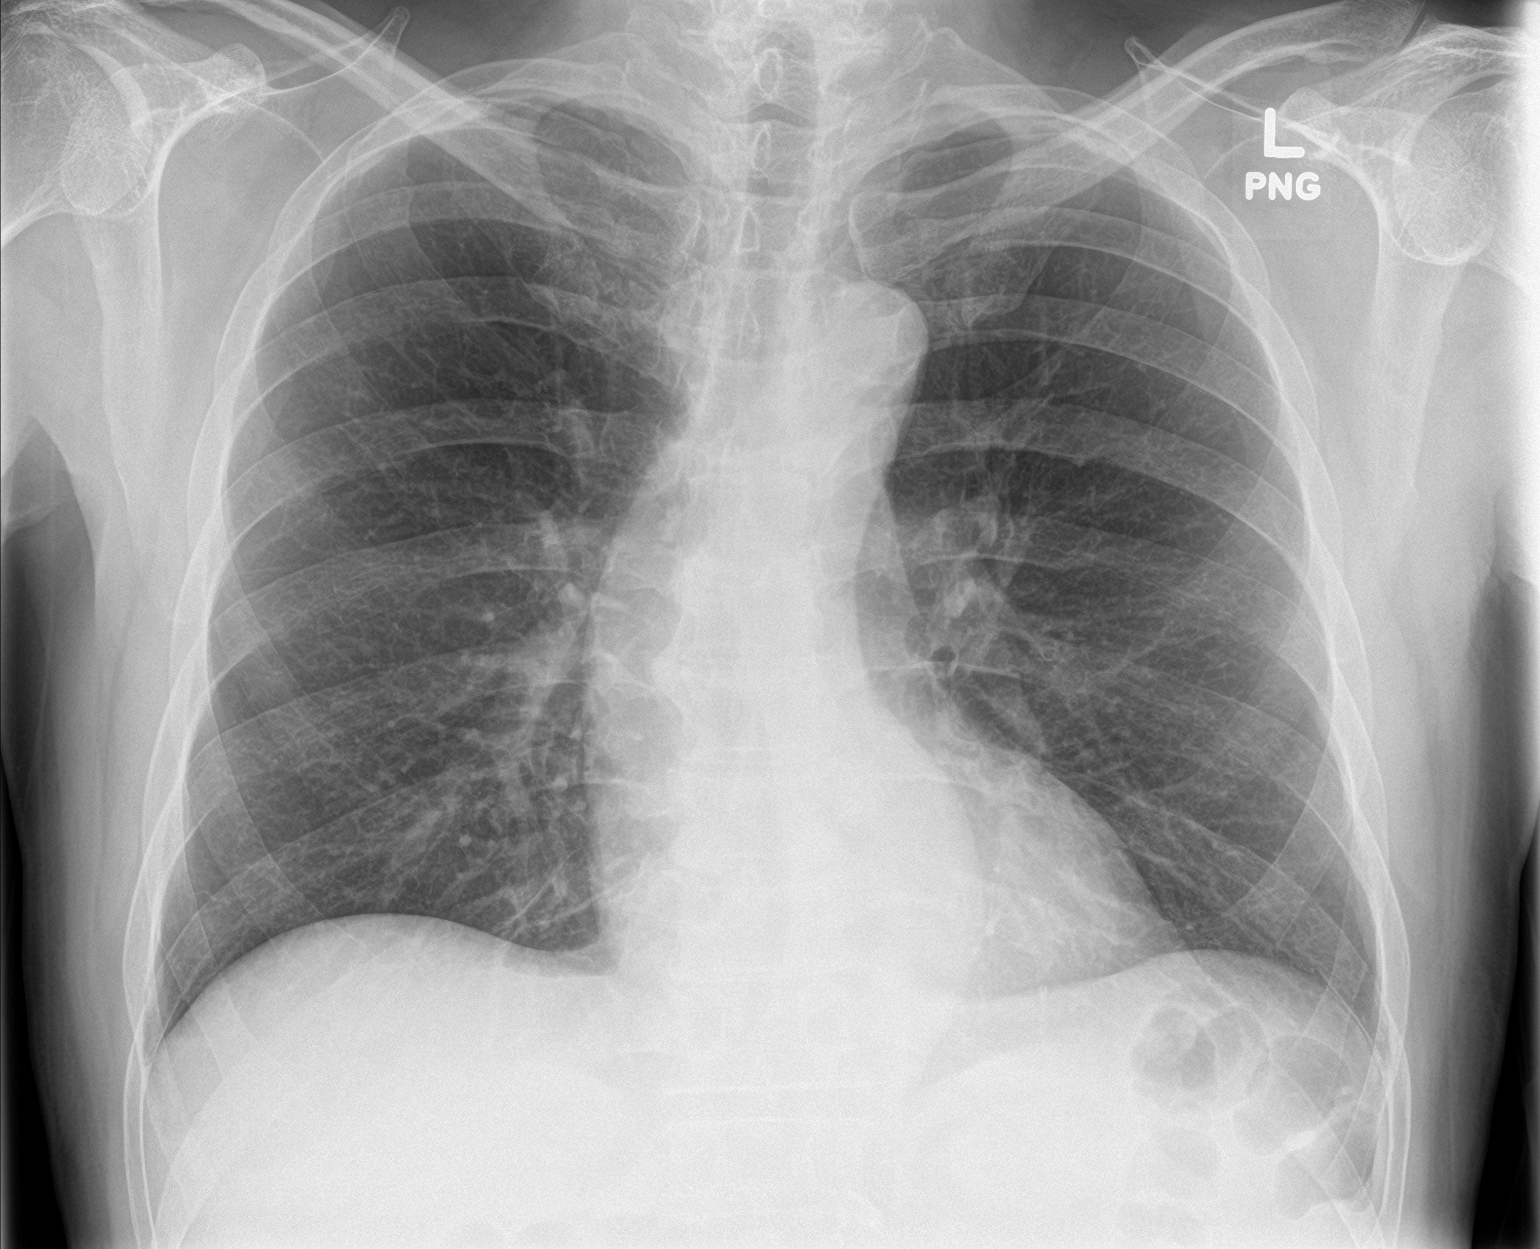

[chest lat]
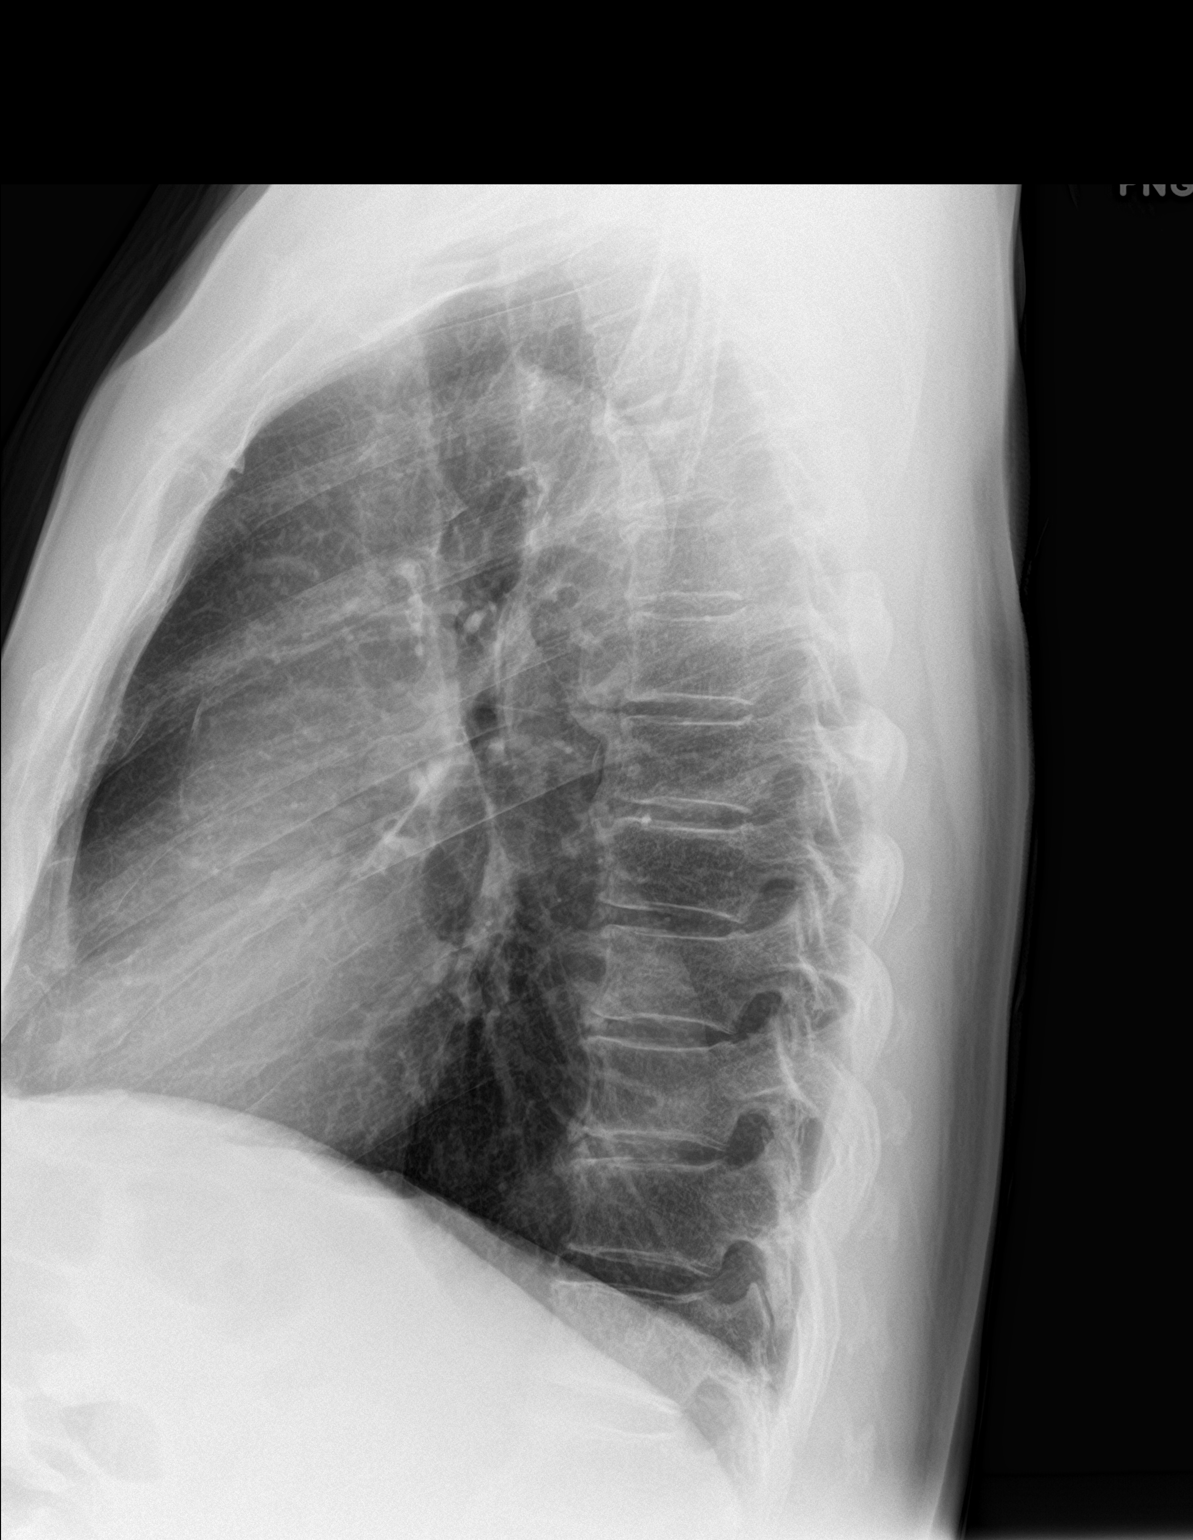

[2 of 2 positions shown; findings below may reference images not displayed]

FINDINGS: No cardiomegaly. Stable aortic tortuosity. There is no edema,
consolidation, effusion, or pneumothorax.
IMPRESSION: Stable exam.  No evidence of active cardiopulmonary disease.

## 2017-10-19 ENCOUNTER — Encounter: Payer: Self-pay | Admitting: *Deleted

## 2017-10-19 DIAGNOSIS — H43391 Other vitreous opacities, right eye: Secondary | ICD-10-CM | POA: Diagnosis not present

## 2017-10-19 DIAGNOSIS — H43311 Vitreous membranes and strands, right eye: Secondary | ICD-10-CM | POA: Diagnosis not present

## 2017-10-22 NOTE — Congregational Nurse Program (Unsigned)
Congregational Nurse Program Note  Date of Encounter: 10/19/2017  Past Medical History: Past Medical History:  Diagnosis Date  . Glaucoma   . Hypertension     Encounter Details: CNP Questionnaire - 10/19/17 1400      Questionnaire   Patient Status  Immigrant    Race  Asian    Location Patient Served At  Not Applicable [facility]   [facility]   Insurance  Medicare    Uninsured  Not Applicable    Food  No food insecurities    Housing/Utilities  Yes, have permanent housing    Transportation  No transportation needs wife droved    wife droved    Chiropractor  Yes, feel physically and emotionally safe where you currently live    Medication  No medication insecurities    Medical Provider  Yes    Referrals  Not Applicable    ED Visit Averted  Not Applicable    Life-Saving Intervention Made  Not Applicable      Pt was on the way to out pt. Surgery center. But still did not understood that procedure. Visited surgery center, talked to Dr. Baird Cancer at bedside, explained vitrctomy will do it  Rt.eye, and replace saline to eye ball. According to Pt. Had cataract surgery on April, Lt eye had Glaucoma and could not see partial prior surgery. Then milky floater showing  Lt eye only, recently had mily stuff floated, like bubbles on the Rt. Eye too. Dr. Bing Plume referraled to Dr. Claudette Stapler. Pt had surgery and need to see Dr. Baird Cancer next 8am at his office.  Dc home after surgery, around 13:45 pm. Will F/P

## 2017-10-28 ENCOUNTER — Encounter: Payer: Self-pay | Admitting: *Deleted

## 2017-10-30 NOTE — Congregational Nurse Program (Unsigned)
Congregational Nurse Program Note  Date of Encounter: 10/28/2017  Past Medical History: Past Medical History:  Diagnosis Date  . Glaucoma   . Hypertension     Encounter Details: CNP Questionnaire - 10/19/17 1400      Questionnaire   Patient Status  Immigrant    Race  Asian    Location Patient Served At  Not Applicable [facility]   [facility]   Insurance  Medicare    Uninsured  Not Applicable    Food  No food insecurities    Housing/Utilities  Yes, have permanent housing    Transportation  No transportation needs wife droved    wife droved    Chiropractor  Yes, feel physically and emotionally safe where you currently live    Medication  No medication insecurities    Medical Provider  Yes    Referrals  Not Applicable    ED Visit Averted  Not Applicable    Life-Saving Intervention Made  Not Applicable      wife said his Rt eye is better, no float anymore seen after vitrectomy. Now, his Lt eye need procedure too. Will f/p

## 2017-11-15 DIAGNOSIS — H3581 Retinal edema: Secondary | ICD-10-CM | POA: Diagnosis not present

## 2017-11-21 DIAGNOSIS — H3581 Retinal edema: Secondary | ICD-10-CM | POA: Diagnosis not present

## 2017-12-05 DIAGNOSIS — H35373 Puckering of macula, bilateral: Secondary | ICD-10-CM | POA: Diagnosis not present

## 2017-12-26 DIAGNOSIS — R739 Hyperglycemia, unspecified: Secondary | ICD-10-CM | POA: Diagnosis not present

## 2017-12-26 DIAGNOSIS — E7800 Pure hypercholesterolemia, unspecified: Secondary | ICD-10-CM | POA: Diagnosis not present

## 2017-12-29 DIAGNOSIS — H35373 Puckering of macula, bilateral: Secondary | ICD-10-CM | POA: Diagnosis not present

## 2017-12-29 DIAGNOSIS — H4423 Degenerative myopia, bilateral: Secondary | ICD-10-CM | POA: Diagnosis not present

## 2017-12-29 DIAGNOSIS — H43812 Vitreous degeneration, left eye: Secondary | ICD-10-CM | POA: Diagnosis not present

## 2017-12-29 DIAGNOSIS — H35433 Paving stone degeneration of retina, bilateral: Secondary | ICD-10-CM | POA: Diagnosis not present

## 2018-01-02 DIAGNOSIS — H26493 Other secondary cataract, bilateral: Secondary | ICD-10-CM | POA: Diagnosis not present

## 2018-01-02 DIAGNOSIS — H20021 Recurrent acute iridocyclitis, right eye: Secondary | ICD-10-CM | POA: Diagnosis not present

## 2018-01-02 DIAGNOSIS — H401133 Primary open-angle glaucoma, bilateral, severe stage: Secondary | ICD-10-CM | POA: Diagnosis not present

## 2018-01-02 DIAGNOSIS — H524 Presbyopia: Secondary | ICD-10-CM | POA: Diagnosis not present

## 2018-01-02 DIAGNOSIS — H04123 Dry eye syndrome of bilateral lacrimal glands: Secondary | ICD-10-CM | POA: Diagnosis not present

## 2018-01-08 DIAGNOSIS — R7309 Other abnormal glucose: Secondary | ICD-10-CM | POA: Diagnosis not present

## 2018-01-08 DIAGNOSIS — I10 Essential (primary) hypertension: Secondary | ICD-10-CM | POA: Diagnosis not present

## 2018-01-08 DIAGNOSIS — Z23 Encounter for immunization: Secondary | ICD-10-CM | POA: Diagnosis not present

## 2018-01-08 DIAGNOSIS — R739 Hyperglycemia, unspecified: Secondary | ICD-10-CM | POA: Diagnosis not present

## 2018-01-08 DIAGNOSIS — E7800 Pure hypercholesterolemia, unspecified: Secondary | ICD-10-CM | POA: Diagnosis not present

## 2018-01-09 DIAGNOSIS — H35351 Cystoid macular degeneration, right eye: Secondary | ICD-10-CM | POA: Diagnosis not present

## 2018-01-09 DIAGNOSIS — H35371 Puckering of macula, right eye: Secondary | ICD-10-CM | POA: Diagnosis not present

## 2018-01-09 DIAGNOSIS — Z961 Presence of intraocular lens: Secondary | ICD-10-CM | POA: Diagnosis not present

## 2018-01-09 DIAGNOSIS — Z0101 Encounter for examination of eyes and vision with abnormal findings: Secondary | ICD-10-CM | POA: Diagnosis not present

## 2018-02-07 DIAGNOSIS — H35433 Paving stone degeneration of retina, bilateral: Secondary | ICD-10-CM | POA: Diagnosis not present

## 2018-02-07 DIAGNOSIS — H43812 Vitreous degeneration, left eye: Secondary | ICD-10-CM | POA: Diagnosis not present

## 2018-02-07 DIAGNOSIS — H4423 Degenerative myopia, bilateral: Secondary | ICD-10-CM | POA: Diagnosis not present

## 2018-02-07 DIAGNOSIS — H35373 Puckering of macula, bilateral: Secondary | ICD-10-CM | POA: Diagnosis not present

## 2018-03-13 DIAGNOSIS — H43812 Vitreous degeneration, left eye: Secondary | ICD-10-CM | POA: Diagnosis not present

## 2018-03-13 DIAGNOSIS — H35373 Puckering of macula, bilateral: Secondary | ICD-10-CM | POA: Diagnosis not present

## 2018-03-13 DIAGNOSIS — H43392 Other vitreous opacities, left eye: Secondary | ICD-10-CM | POA: Diagnosis not present

## 2018-03-13 DIAGNOSIS — H35433 Paving stone degeneration of retina, bilateral: Secondary | ICD-10-CM | POA: Diagnosis not present

## 2018-03-26 DIAGNOSIS — H43392 Other vitreous opacities, left eye: Secondary | ICD-10-CM | POA: Diagnosis not present

## 2018-03-26 DIAGNOSIS — H43312 Vitreous membranes and strands, left eye: Secondary | ICD-10-CM | POA: Diagnosis not present

## 2018-04-03 DIAGNOSIS — E7800 Pure hypercholesterolemia, unspecified: Secondary | ICD-10-CM | POA: Diagnosis not present

## 2018-04-03 DIAGNOSIS — I10 Essential (primary) hypertension: Secondary | ICD-10-CM | POA: Diagnosis not present

## 2018-04-03 DIAGNOSIS — R7309 Other abnormal glucose: Secondary | ICD-10-CM | POA: Diagnosis not present

## 2018-04-20 DIAGNOSIS — F17200 Nicotine dependence, unspecified, uncomplicated: Secondary | ICD-10-CM | POA: Diagnosis not present

## 2018-04-20 DIAGNOSIS — I10 Essential (primary) hypertension: Secondary | ICD-10-CM | POA: Diagnosis not present

## 2018-04-20 DIAGNOSIS — E7800 Pure hypercholesterolemia, unspecified: Secondary | ICD-10-CM | POA: Diagnosis not present

## 2018-04-20 DIAGNOSIS — E781 Pure hyperglyceridemia: Secondary | ICD-10-CM | POA: Diagnosis not present

## 2018-04-20 DIAGNOSIS — Z0000 Encounter for general adult medical examination without abnormal findings: Secondary | ICD-10-CM | POA: Diagnosis not present

## 2018-04-20 DIAGNOSIS — R05 Cough: Secondary | ICD-10-CM | POA: Diagnosis not present

## 2018-04-20 DIAGNOSIS — R0600 Dyspnea, unspecified: Secondary | ICD-10-CM | POA: Diagnosis not present

## 2018-04-20 DIAGNOSIS — K219 Gastro-esophageal reflux disease without esophagitis: Secondary | ICD-10-CM | POA: Diagnosis not present

## 2018-04-20 DIAGNOSIS — R7309 Other abnormal glucose: Secondary | ICD-10-CM | POA: Diagnosis not present

## 2018-07-03 DIAGNOSIS — H401133 Primary open-angle glaucoma, bilateral, severe stage: Secondary | ICD-10-CM | POA: Diagnosis not present

## 2018-07-03 DIAGNOSIS — H532 Diplopia: Secondary | ICD-10-CM | POA: Diagnosis not present

## 2018-07-03 DIAGNOSIS — H04123 Dry eye syndrome of bilateral lacrimal glands: Secondary | ICD-10-CM | POA: Diagnosis not present

## 2018-07-03 DIAGNOSIS — H20021 Recurrent acute iridocyclitis, right eye: Secondary | ICD-10-CM | POA: Diagnosis not present

## 2018-08-29 DIAGNOSIS — I10 Essential (primary) hypertension: Secondary | ICD-10-CM | POA: Diagnosis not present

## 2018-08-29 DIAGNOSIS — I959 Hypotension, unspecified: Secondary | ICD-10-CM | POA: Diagnosis not present

## 2018-10-31 DIAGNOSIS — H35373 Puckering of macula, bilateral: Secondary | ICD-10-CM | POA: Diagnosis not present

## 2018-10-31 DIAGNOSIS — H35433 Paving stone degeneration of retina, bilateral: Secondary | ICD-10-CM | POA: Diagnosis not present

## 2018-10-31 DIAGNOSIS — H3581 Retinal edema: Secondary | ICD-10-CM | POA: Diagnosis not present

## 2018-11-10 IMAGING — MR MR CERVICAL SPINE W/O CM
5 series · 48 of 48 positions shown · non-contrast
Comparison: Prior radiograph from 06/26/2017.

CLINICAL DATA: Initial evaluation for chronic persistent neck pain
with left arm numbness.

EXAM:
MRI CERVICAL SPINE WITHOUT CONTRAST
TECHNIQUE: Multiplanar, multisequence MR imaging of the cervical spine was
performed. No intravenous contrast was administered.

[Series 3: T2 · sagittal · 3.0mm · 0.82mm/px · 6 of 12 slices shown (1 of 2)]
[im 1/12]
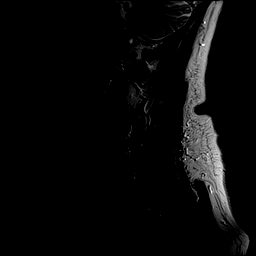
[im 3/12]
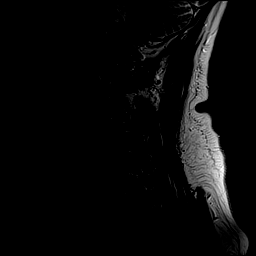
[im 5/12]
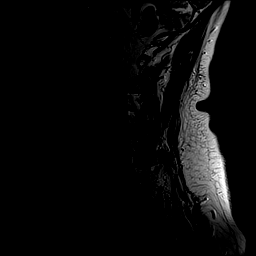
[im 7/12]
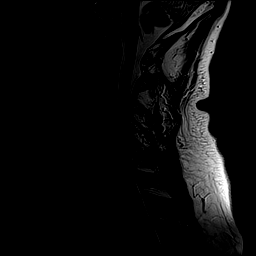
[im 9/12]
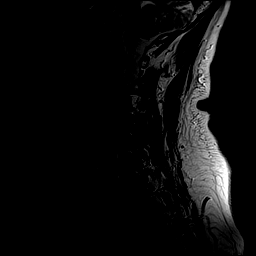
[im 12/12]
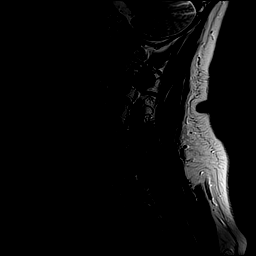

[Series 4: T1 · sagittal · 3.0mm · 0.82mm/px · 7 of 12 slices shown]
[im 1/12]
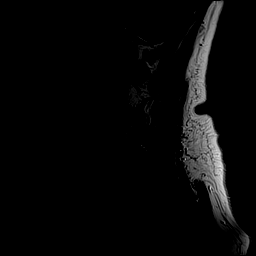
[im 2/12]
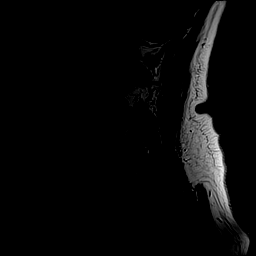
[im 4/12]
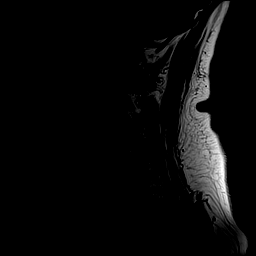
[im 6/12]
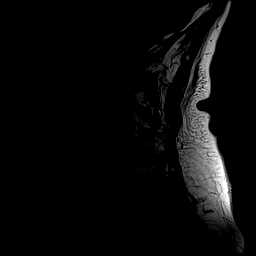
[im 8/12]
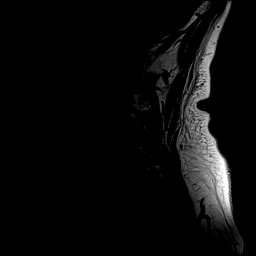
[im 10/12]
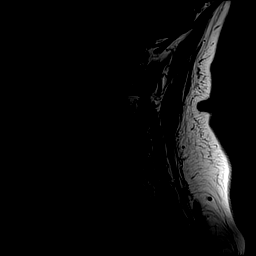
[im 12/12]
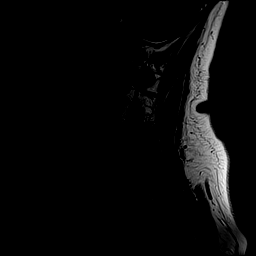

[Series 5: STIR · sagittal · 3.0mm · 0.82mm/px · 7 of 12 slices shown]
[im 1/12]
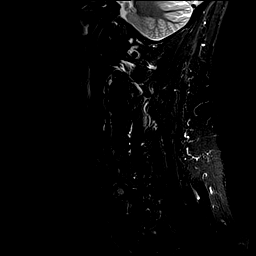
[im 2/12]
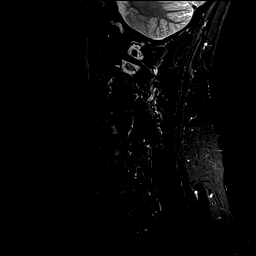
[im 4/12]
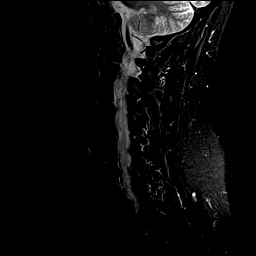
[im 6/12]
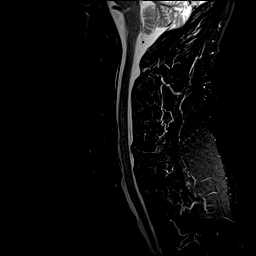
[im 8/12]
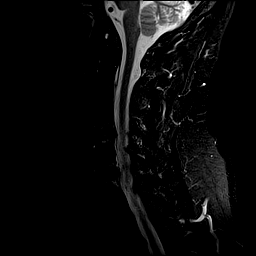
[im 10/12]
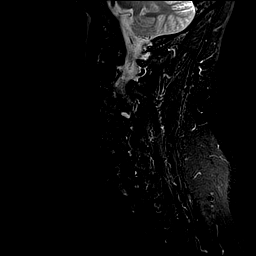
[im 12/12]
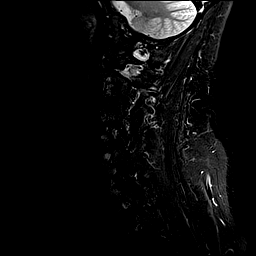

[Series 6: T2 · axial · 3.0mm · 0.74mm/px · z∈[-87,+5]mm · 14 of 26 slices shown (2 of 2)]
[im 1/26]
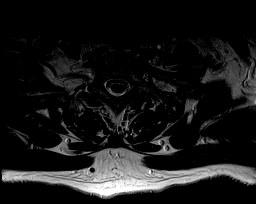
[im 2/26]
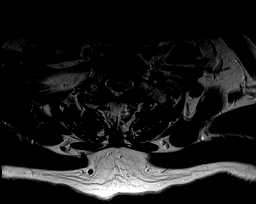
[im 4/26]
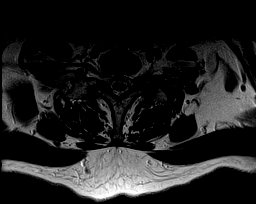
[im 6/26]
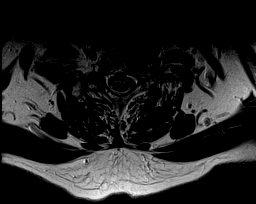
[im 8/26]
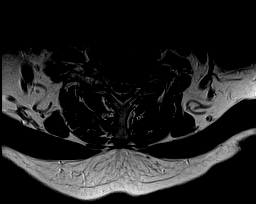
[im 10/26]
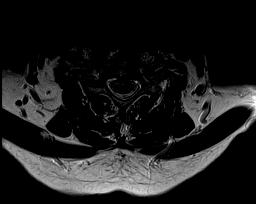
[im 12/26]
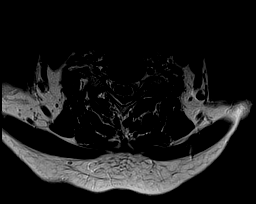
[im 14/26]
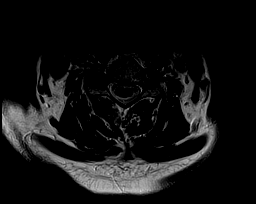
[im 16/26]
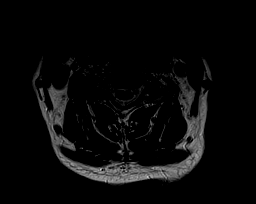
[im 18/26]
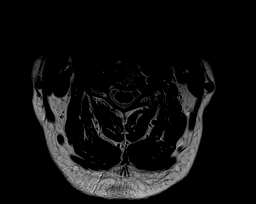
[im 20/26]
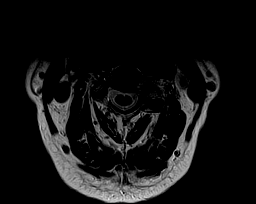
[im 22/26]
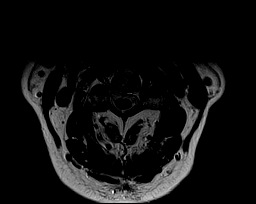
[im 24/26]
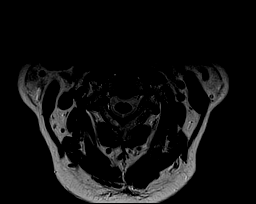
[im 26/26]
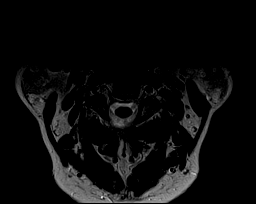

[Series 7: GRE · axial · 3.0mm · 0.74mm/px · z∈[-87,+5]mm · 14 of 26 slices shown]
[im 1/26]
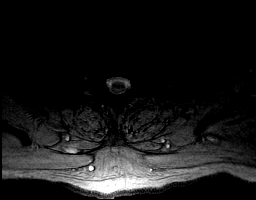
[im 2/26]
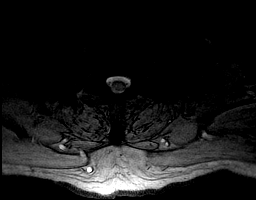
[im 4/26]
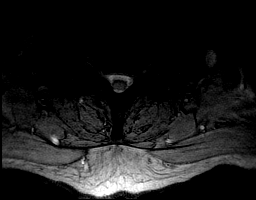
[im 6/26]
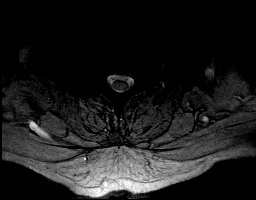
[im 8/26]
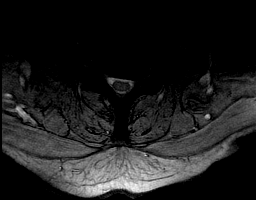
[im 10/26]
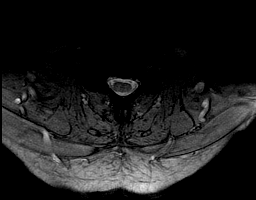
[im 12/26]
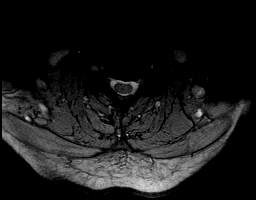
[im 14/26]
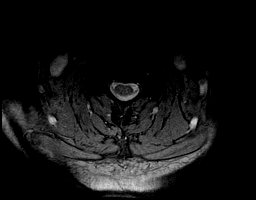
[im 16/26]
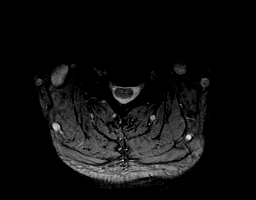
[im 18/26]
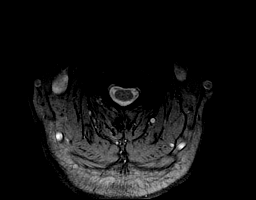
[im 20/26]
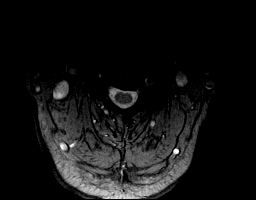
[im 22/26]
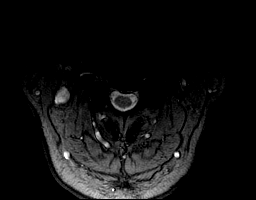
[im 24/26]
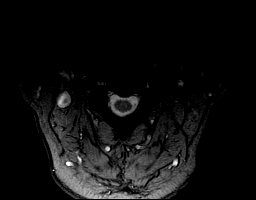
[im 26/26]
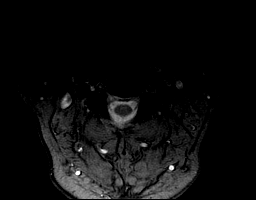

[48 of 48 positions shown; findings below may reference images not displayed]

FINDINGS: Alignment: Vertebral bodies normally aligned with preservation of
the normal cervical lordosis. No listhesis.

Vertebrae: Vertebral body heights well maintained without evidence
for acute or chronic fracture. Chronic reactive endplate changes
present about the C4-5 and C6-7 interspaces. Bone marrow signal
intensity within normal limits. No discrete or worrisome osseous
lesions.

Cord: Signal intensity within the cervical spinal cord is normal.

Posterior Fossa, vertebral arteries, paraspinal tissues: Visualized
brain and posterior fossa within normal limits. Craniocervical
junction normal. Paraspinous and prevertebral soft tissues are
normal. Normal intravascular flow voids present within the vertebral
arteries bilaterally. Right vertebral artery hypoplastic.

Disc levels:

C2-C3: Normal interspace. Mild bilateral facet hypertrophy. No canal
or foraminal stenosis.

C3-C4: No significant disc bulge. Left greater than right
uncovertebral hypertrophy. Advanced left-sided facet arthrosis.
Resultant severe left with mild right C4 foraminal narrowing. No
canal stenosis.

C4-C5: Chronic diffuse degenerative disc osteophyte with right worse
than left uncovertebral hypertrophy. Advanced right-sided facet
arthrosis. Posterior disc osteophyte mildly flattens the ventral
thecal sac without significant spinal stenosis. Severe right C5
foraminal stenosis. No significant left foraminal narrowing.

C5-C6: Mild diffuse disc bulge with bilateral uncovertebral
hypertrophy. No significant canal or foraminal stenosis.

C6-C7: Chronic diffuse degenerative disc osteophyte, slightly
eccentric to the left. Broad posterior component flattens the
ventral thecal sac without significant spinal stenosis. Severe left
with moderate right C7 foraminal narrowing.

C7-T1: Minimal annular disc bulge. Moderate facet hypertrophy. No
significant canal stenosis. Foramina remain patent.

Minimal disc bulging noted at T1-2 and T2-3 without significant
stenosis. Remainder the visualized upper thoracic spine within
normal limits.
IMPRESSION: 1. Multilevel cervical spondylolysis as above, most notable at C4-5
through C6-7. No significant spinal stenosis.
2. Multifactorial degenerative changes with resultant multilevel
foraminal narrowing as above. Notable findings include severe left
C4 foraminal stenosis, severe right C5 foraminal narrowing, with
severe left and moderate right C7 foraminal stenosis.

## 2018-11-10 IMAGING — MR MR LUMBAR SPINE W/O CM
5 series · 40 of 48 positions shown · non-contrast
Comparison: Prior radiograph from 06/26/2017.

CLINICAL DATA: Initial evaluation for chronic persistent low back
pain with left leg weakness.

EXAM:
MRI LUMBAR SPINE WITHOUT CONTRAST
TECHNIQUE: Multiplanar, multisequence MR imaging of the lumbar spine was
performed. No intravenous contrast was administered.

[Series 2: T2 post-contrast · sagittal · 4.0mm · 0.88mm/px · 6 of 13 slices shown]
[im 1/13]
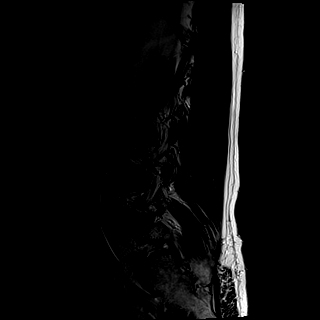
[im 3/13]
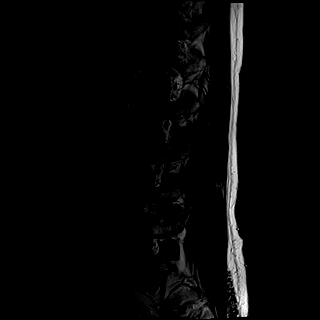
[im 5/13]
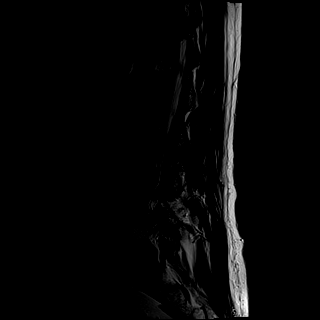
[im 8/13]
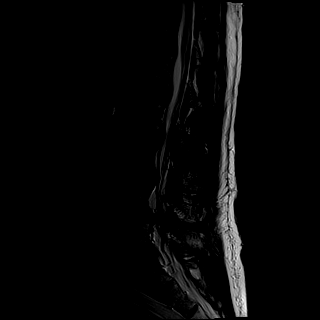
[im 10/13]
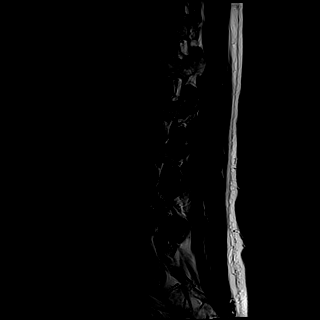
[im 13/13]
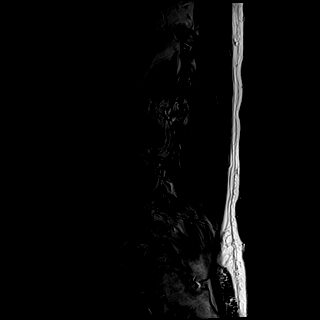

[Series 3: T1 · sagittal · 4.0mm · 0.88mm/px · 6 of 13 slices shown (1 of 2)]
[im 1/13]
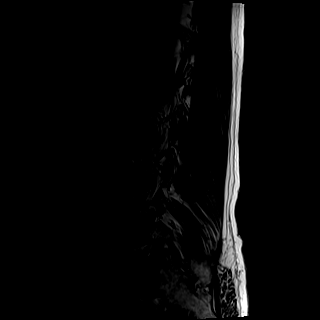
[im 3/13]
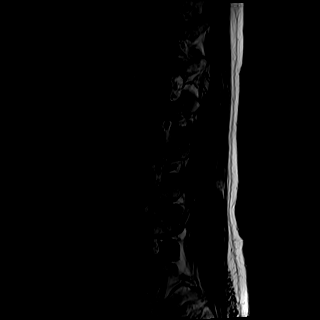
[im 5/13]
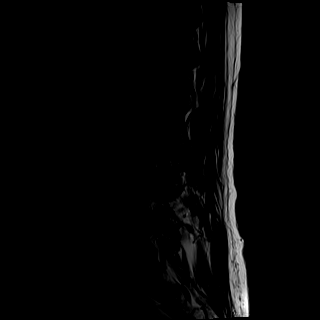
[im 8/13]
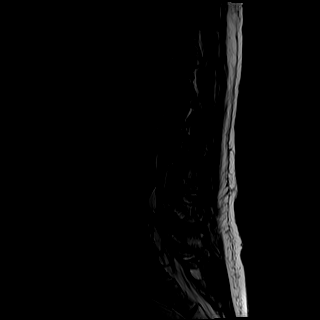
[im 10/13]
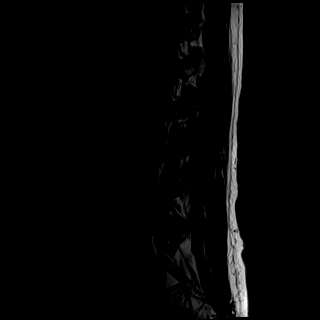
[im 13/13]
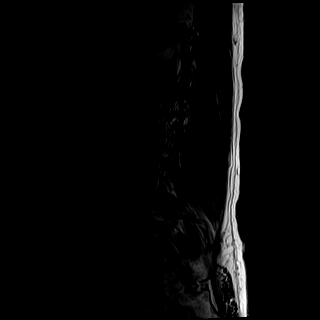

[Series 4: STIR · sagittal · 4.0mm · 0.55mm/px · 6 of 13 slices shown]
[im 1/13]
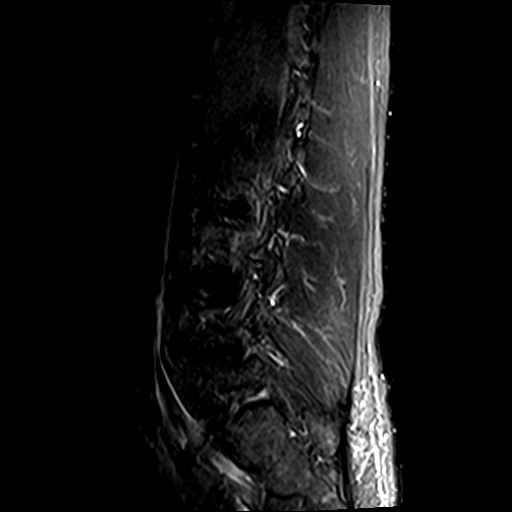
[im 3/13]
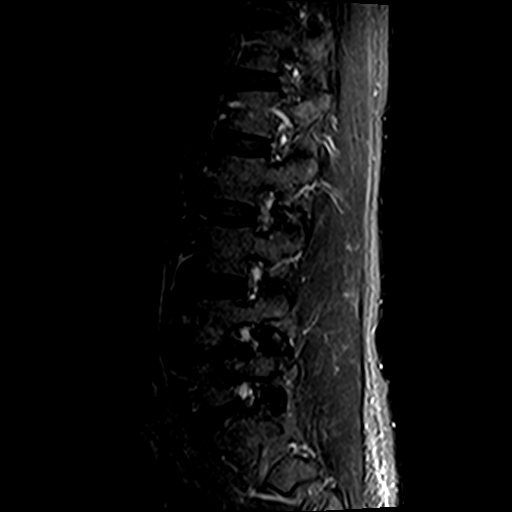
[im 5/13]
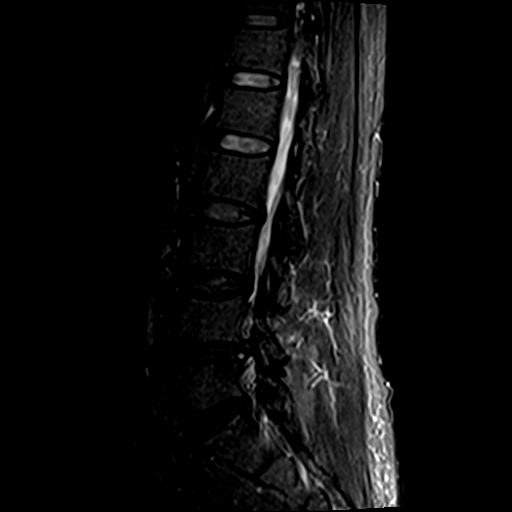
[im 8/13]
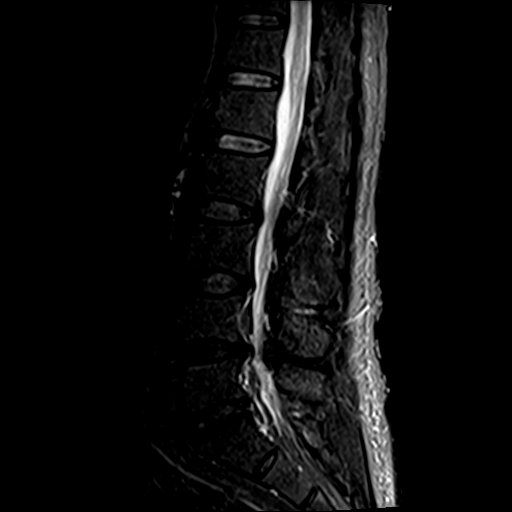
[im 10/13]
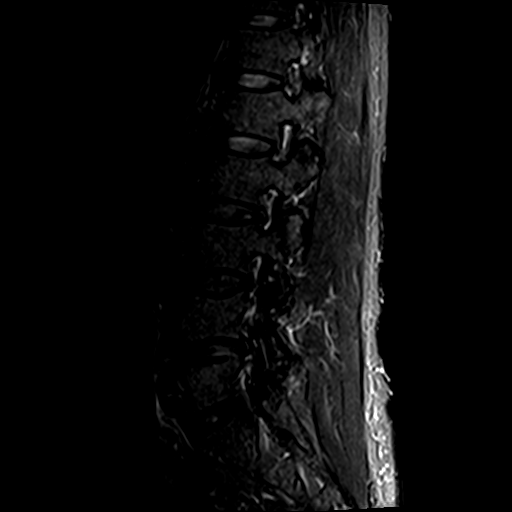
[im 13/13]
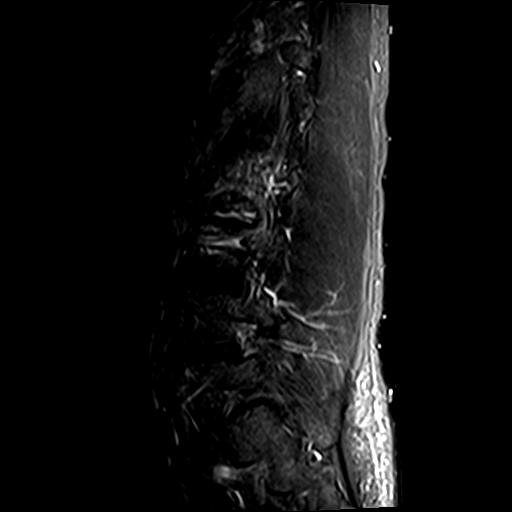

[Series 5: T1 · axial · 4.0mm · 0.78mm/px · z∈[-168,+52]mm · 9 of 35 slices shown (2 of 2)]
[im 1/35]
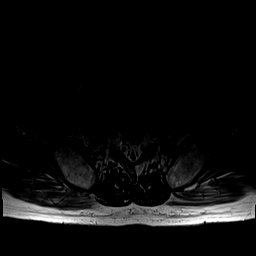
[im 5/35]
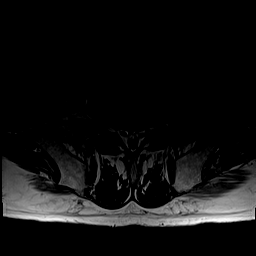
[im 10/35]
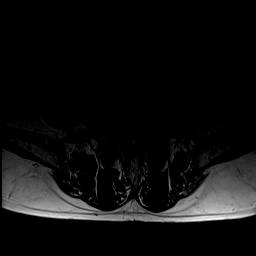
[im 15/35]
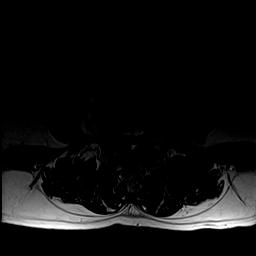
[im 18/35]
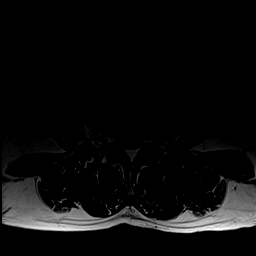
[im 20/35]
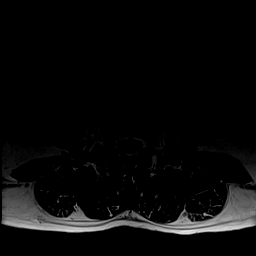
[im 25/35]
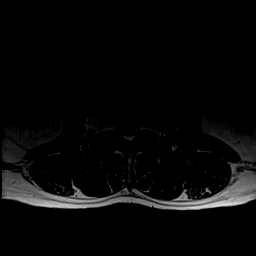
[im 30/35]
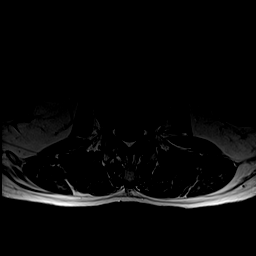
[im 35/35]
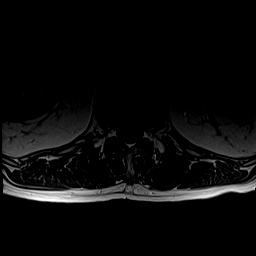

[Series 6: T2 · axial · 4.0mm · 0.74mm/px · z∈[-167,+51]mm · 13 of 35 slices shown]
[im 1/35]
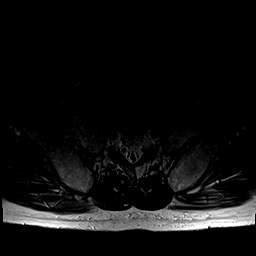
[im 3/35]
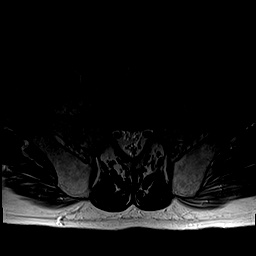
[im 5/35]
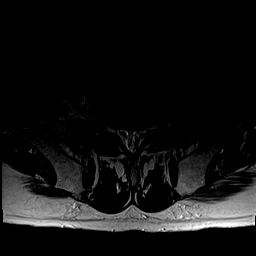
[im 8/35]
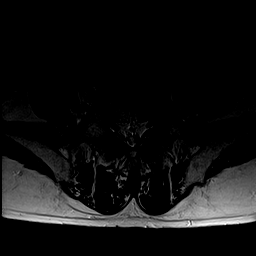
[im 10/35]
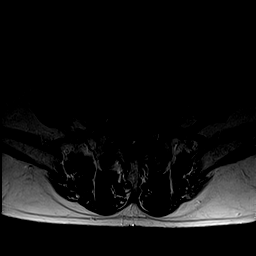
[im 13/35]
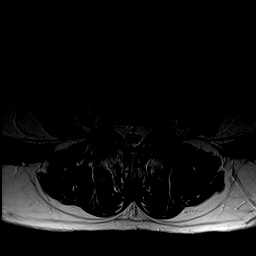
[im 15/35]
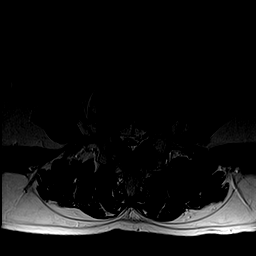
[im 18/35]
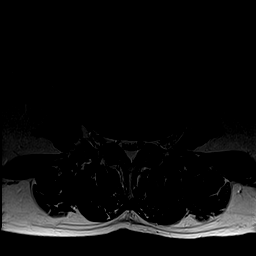
[im 20/35]
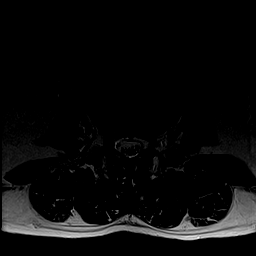
[im 22/35]
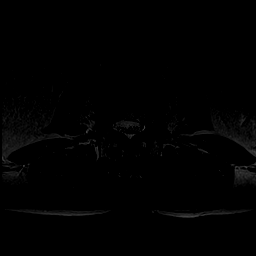
[im 25/35]
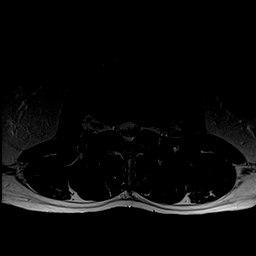
[im 30/35]
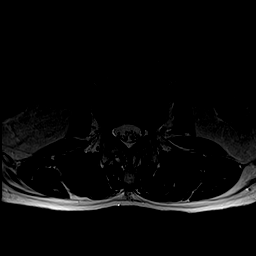
[im 35/35]
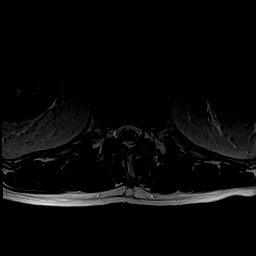

[40 of 48 positions shown; findings below may reference images not displayed]

FINDINGS: Segmentation: Normal segmentation. Lowest well-formed disc labeled
the L5-S1 level.

Alignment: Vertebral bodies normally aligned with preservation of
the normal lumbar lordosis. No listhesis.

Vertebrae: Vertebral body heights well maintained without evidence
for acute or chronic fracture. Bone marrow signal intensity within
normal limits. Benign hemangioma noted within the L1 vertebral body.
No worrisome osseous lesions. No abnormal marrow edema.

Conus medullaris and cauda equina: Conus extends to the L1 level.
Conus and cauda equina appear normal.

Paraspinal and other soft tissues: Paraspinous soft tissues are
within normal limits. T2 hyperintense cyst noted within left kidney.
Visualized visceral structures otherwise unremarkable.

Disc levels:

T12-L1: Normal interspace. Mild facet hypertrophy. No canal or
foraminal stenosis.

L1-2: No significant disc bulge. Anterior endplate osteophytic
spurring. Mild to moderate facet and ligament flavum hypertrophy. No
significant stenosis.

L2-3: Mild annular disc bulge with disc desiccation. Prominent left
far lateral reactive endplate changes with marginal endplate
osteophytic spurring, with slight lateral displacement of the
exiting left L2 nerve root. Moderate facet and ligament flavum
hypertrophy. Resultant mild left lateral recess narrowing without
significant spinal stenosis. Foramina remain patent.

L3-4: Diffuse disc bulge with disc desiccation with mild
circumferential reactive endplate changes. Prominent central annular
fissure noted. Moderate facet and ligament flavum hypertrophy. Mild
epidural lipomatosis. Resultant mild to moderate canal with
bilateral lateral recess stenosis. Foramina remain patent.

L4-5: Diffuse degenerative disc bulge with disc desiccation and
intervertebral disc space narrowing. Associated circumferential
chronic reactive endplate changes. Advanced facet and ligamentum
flavum hypertrophy. Resultant severe canal and bilateral
subarticular stenosis. Moderate left with mild right L4 foraminal
narrowing.

L5-S1: Mild diffuse disc bulge, slightly eccentric to the left.
Posterior annular fissure. Mild facet ligament flavum hypertrophy.
Epidural lipomatosis. No significant canal or lateral recess
stenosis. Foramina remain patent.
IMPRESSION: 1. Chronic degenerative disc disease and facet hypertrophy at L4-5
with resultant severe canal and bilateral lateral recess stenosis,
with moderate left L4 foraminal narrowing.
2. Multifactorial degenerative changes at L3-4 with resultant mild
to moderate canal and lateral recess stenosis.
3. Additional more mild disc bulging and facet hypertrophy at L1-2
and L2-3 without significant stenosis or neural impingement.

## 2018-11-21 ENCOUNTER — Other Ambulatory Visit: Payer: Self-pay

## 2018-12-18 DIAGNOSIS — H04123 Dry eye syndrome of bilateral lacrimal glands: Secondary | ICD-10-CM | POA: Diagnosis not present

## 2018-12-18 DIAGNOSIS — H26491 Other secondary cataract, right eye: Secondary | ICD-10-CM | POA: Diagnosis not present

## 2018-12-18 DIAGNOSIS — H20021 Recurrent acute iridocyclitis, right eye: Secondary | ICD-10-CM | POA: Diagnosis not present

## 2018-12-18 DIAGNOSIS — H401133 Primary open-angle glaucoma, bilateral, severe stage: Secondary | ICD-10-CM | POA: Diagnosis not present

## 2018-12-18 DIAGNOSIS — H524 Presbyopia: Secondary | ICD-10-CM | POA: Diagnosis not present

## 2019-01-02 DIAGNOSIS — Z23 Encounter for immunization: Secondary | ICD-10-CM | POA: Diagnosis not present

## 2019-01-30 DIAGNOSIS — H35373 Puckering of macula, bilateral: Secondary | ICD-10-CM | POA: Diagnosis not present

## 2019-02-19 DIAGNOSIS — H401133 Primary open-angle glaucoma, bilateral, severe stage: Secondary | ICD-10-CM | POA: Diagnosis not present

## 2019-02-19 DIAGNOSIS — H04123 Dry eye syndrome of bilateral lacrimal glands: Secondary | ICD-10-CM | POA: Diagnosis not present

## 2019-02-19 DIAGNOSIS — Z961 Presence of intraocular lens: Secondary | ICD-10-CM | POA: Diagnosis not present

## 2019-02-19 DIAGNOSIS — H43811 Vitreous degeneration, right eye: Secondary | ICD-10-CM | POA: Diagnosis not present

## 2019-05-01 DIAGNOSIS — H35373 Puckering of macula, bilateral: Secondary | ICD-10-CM | POA: Diagnosis not present

## 2019-05-01 DIAGNOSIS — H4423 Degenerative myopia, bilateral: Secondary | ICD-10-CM | POA: Diagnosis not present

## 2019-05-13 DIAGNOSIS — R739 Hyperglycemia, unspecified: Secondary | ICD-10-CM | POA: Diagnosis not present

## 2019-05-13 DIAGNOSIS — I10 Essential (primary) hypertension: Secondary | ICD-10-CM | POA: Diagnosis not present

## 2019-05-13 DIAGNOSIS — Z125 Encounter for screening for malignant neoplasm of prostate: Secondary | ICD-10-CM | POA: Diagnosis not present

## 2019-05-13 DIAGNOSIS — E7800 Pure hypercholesterolemia, unspecified: Secondary | ICD-10-CM | POA: Diagnosis not present

## 2019-05-21 DIAGNOSIS — F17200 Nicotine dependence, unspecified, uncomplicated: Secondary | ICD-10-CM | POA: Diagnosis not present

## 2019-05-21 DIAGNOSIS — E7800 Pure hypercholesterolemia, unspecified: Secondary | ICD-10-CM | POA: Diagnosis not present

## 2019-05-21 DIAGNOSIS — I10 Essential (primary) hypertension: Secondary | ICD-10-CM | POA: Diagnosis not present

## 2019-05-21 DIAGNOSIS — K219 Gastro-esophageal reflux disease without esophagitis: Secondary | ICD-10-CM | POA: Diagnosis not present

## 2019-05-21 DIAGNOSIS — E119 Type 2 diabetes mellitus without complications: Secondary | ICD-10-CM | POA: Diagnosis not present

## 2019-05-21 DIAGNOSIS — Z125 Encounter for screening for malignant neoplasm of prostate: Secondary | ICD-10-CM | POA: Diagnosis not present

## 2019-05-21 DIAGNOSIS — Z0000 Encounter for general adult medical examination without abnormal findings: Secondary | ICD-10-CM | POA: Diagnosis not present

## 2019-06-11 DIAGNOSIS — H43811 Vitreous degeneration, right eye: Secondary | ICD-10-CM | POA: Diagnosis not present

## 2019-06-11 DIAGNOSIS — H20021 Recurrent acute iridocyclitis, right eye: Secondary | ICD-10-CM | POA: Diagnosis not present

## 2019-06-11 DIAGNOSIS — Z961 Presence of intraocular lens: Secondary | ICD-10-CM | POA: Diagnosis not present

## 2019-06-11 DIAGNOSIS — H04123 Dry eye syndrome of bilateral lacrimal glands: Secondary | ICD-10-CM | POA: Diagnosis not present

## 2019-07-16 DIAGNOSIS — H401133 Primary open-angle glaucoma, bilateral, severe stage: Secondary | ICD-10-CM | POA: Diagnosis not present

## 2019-09-06 DIAGNOSIS — M79642 Pain in left hand: Secondary | ICD-10-CM | POA: Diagnosis not present

## 2019-09-06 DIAGNOSIS — M19041 Primary osteoarthritis, right hand: Secondary | ICD-10-CM | POA: Diagnosis not present

## 2019-09-06 DIAGNOSIS — M19042 Primary osteoarthritis, left hand: Secondary | ICD-10-CM | POA: Diagnosis not present

## 2019-09-09 DIAGNOSIS — M1990 Unspecified osteoarthritis, unspecified site: Secondary | ICD-10-CM | POA: Diagnosis not present

## 2019-09-09 DIAGNOSIS — K2270 Barrett's esophagus without dysplasia: Secondary | ICD-10-CM | POA: Diagnosis not present

## 2019-09-09 DIAGNOSIS — I10 Essential (primary) hypertension: Secondary | ICD-10-CM | POA: Diagnosis not present

## 2019-09-09 DIAGNOSIS — M6530 Trigger finger, unspecified finger: Secondary | ICD-10-CM | POA: Diagnosis not present

## 2019-11-11 DIAGNOSIS — I10 Essential (primary) hypertension: Secondary | ICD-10-CM | POA: Diagnosis not present

## 2019-11-11 DIAGNOSIS — Z125 Encounter for screening for malignant neoplasm of prostate: Secondary | ICD-10-CM | POA: Diagnosis not present

## 2019-11-11 DIAGNOSIS — R739 Hyperglycemia, unspecified: Secondary | ICD-10-CM | POA: Diagnosis not present

## 2019-11-11 DIAGNOSIS — E7800 Pure hypercholesterolemia, unspecified: Secondary | ICD-10-CM | POA: Diagnosis not present

## 2019-12-25 DIAGNOSIS — R42 Dizziness and giddiness: Secondary | ICD-10-CM | POA: Diagnosis not present

## 2019-12-25 DIAGNOSIS — R5383 Other fatigue: Secondary | ICD-10-CM | POA: Diagnosis not present

## 2019-12-25 DIAGNOSIS — I951 Orthostatic hypotension: Secondary | ICD-10-CM | POA: Diagnosis not present

## 2020-01-08 DIAGNOSIS — I951 Orthostatic hypotension: Secondary | ICD-10-CM | POA: Diagnosis not present

## 2020-01-08 DIAGNOSIS — Z0000 Encounter for general adult medical examination without abnormal findings: Secondary | ICD-10-CM | POA: Diagnosis not present

## 2020-01-08 DIAGNOSIS — E7800 Pure hypercholesterolemia, unspecified: Secondary | ICD-10-CM | POA: Diagnosis not present

## 2020-01-08 DIAGNOSIS — Z79899 Other long term (current) drug therapy: Secondary | ICD-10-CM | POA: Diagnosis not present

## 2020-01-08 DIAGNOSIS — Z1339 Encounter for screening examination for other mental health and behavioral disorders: Secondary | ICD-10-CM | POA: Diagnosis not present

## 2020-01-08 DIAGNOSIS — Z114 Encounter for screening for human immunodeficiency virus [HIV]: Secondary | ICD-10-CM | POA: Diagnosis not present

## 2020-01-08 DIAGNOSIS — Z1331 Encounter for screening for depression: Secondary | ICD-10-CM | POA: Diagnosis not present

## 2020-01-08 DIAGNOSIS — R42 Dizziness and giddiness: Secondary | ICD-10-CM | POA: Diagnosis not present

## 2020-01-08 DIAGNOSIS — Z125 Encounter for screening for malignant neoplasm of prostate: Secondary | ICD-10-CM | POA: Diagnosis not present

## 2020-01-08 DIAGNOSIS — R0602 Shortness of breath: Secondary | ICD-10-CM | POA: Diagnosis not present

## 2020-01-08 DIAGNOSIS — R5383 Other fatigue: Secondary | ICD-10-CM | POA: Diagnosis not present

## 2020-01-08 DIAGNOSIS — I10 Essential (primary) hypertension: Secondary | ICD-10-CM | POA: Diagnosis not present

## 2020-01-08 DIAGNOSIS — Z87891 Personal history of nicotine dependence: Secondary | ICD-10-CM | POA: Diagnosis not present

## 2020-01-08 DIAGNOSIS — R21 Rash and other nonspecific skin eruption: Secondary | ICD-10-CM | POA: Diagnosis not present

## 2020-01-08 DIAGNOSIS — E559 Vitamin D deficiency, unspecified: Secondary | ICD-10-CM | POA: Diagnosis not present

## 2020-01-08 DIAGNOSIS — Z23 Encounter for immunization: Secondary | ICD-10-CM | POA: Diagnosis not present

## 2020-01-08 DIAGNOSIS — Z131 Encounter for screening for diabetes mellitus: Secondary | ICD-10-CM | POA: Diagnosis not present

## 2020-01-10 DIAGNOSIS — Z23 Encounter for immunization: Secondary | ICD-10-CM | POA: Diagnosis not present

## 2020-01-29 DIAGNOSIS — E7800 Pure hypercholesterolemia, unspecified: Secondary | ICD-10-CM | POA: Diagnosis not present

## 2020-01-29 DIAGNOSIS — N182 Chronic kidney disease, stage 2 (mild): Secondary | ICD-10-CM | POA: Diagnosis not present

## 2020-01-29 DIAGNOSIS — I10 Essential (primary) hypertension: Secondary | ICD-10-CM | POA: Diagnosis not present

## 2020-01-29 DIAGNOSIS — R7303 Prediabetes: Secondary | ICD-10-CM | POA: Diagnosis not present

## 2020-01-30 DIAGNOSIS — H04123 Dry eye syndrome of bilateral lacrimal glands: Secondary | ICD-10-CM | POA: Diagnosis not present

## 2020-01-30 DIAGNOSIS — H401123 Primary open-angle glaucoma, left eye, severe stage: Secondary | ICD-10-CM | POA: Diagnosis not present

## 2020-01-30 DIAGNOSIS — H401112 Primary open-angle glaucoma, right eye, moderate stage: Secondary | ICD-10-CM | POA: Diagnosis not present

## 2020-01-30 DIAGNOSIS — Z961 Presence of intraocular lens: Secondary | ICD-10-CM | POA: Diagnosis not present

## 2020-01-30 DIAGNOSIS — H20021 Recurrent acute iridocyclitis, right eye: Secondary | ICD-10-CM | POA: Diagnosis not present

## 2020-01-30 DIAGNOSIS — H43811 Vitreous degeneration, right eye: Secondary | ICD-10-CM | POA: Diagnosis not present

## 2020-03-10 DIAGNOSIS — Z23 Encounter for immunization: Secondary | ICD-10-CM | POA: Diagnosis not present

## 2020-05-06 DIAGNOSIS — R7303 Prediabetes: Secondary | ICD-10-CM | POA: Diagnosis not present

## 2020-05-06 DIAGNOSIS — E559 Vitamin D deficiency, unspecified: Secondary | ICD-10-CM | POA: Diagnosis not present

## 2020-05-06 DIAGNOSIS — I10 Essential (primary) hypertension: Secondary | ICD-10-CM | POA: Diagnosis not present

## 2020-05-06 DIAGNOSIS — M25561 Pain in right knee: Secondary | ICD-10-CM | POA: Diagnosis not present

## 2020-05-06 DIAGNOSIS — E7800 Pure hypercholesterolemia, unspecified: Secondary | ICD-10-CM | POA: Diagnosis not present

## 2020-05-06 DIAGNOSIS — N182 Chronic kidney disease, stage 2 (mild): Secondary | ICD-10-CM | POA: Diagnosis not present

## 2020-05-19 DIAGNOSIS — M79661 Pain in right lower leg: Secondary | ICD-10-CM | POA: Diagnosis not present

## 2020-05-19 DIAGNOSIS — M79662 Pain in left lower leg: Secondary | ICD-10-CM | POA: Diagnosis not present

## 2020-05-29 DIAGNOSIS — H401123 Primary open-angle glaucoma, left eye, severe stage: Secondary | ICD-10-CM | POA: Diagnosis not present

## 2020-05-29 DIAGNOSIS — H401112 Primary open-angle glaucoma, right eye, moderate stage: Secondary | ICD-10-CM | POA: Diagnosis not present

## 2020-08-27 DIAGNOSIS — H401112 Primary open-angle glaucoma, right eye, moderate stage: Secondary | ICD-10-CM | POA: Diagnosis not present

## 2020-08-27 DIAGNOSIS — H401123 Primary open-angle glaucoma, left eye, severe stage: Secondary | ICD-10-CM | POA: Diagnosis not present

## 2020-09-30 DIAGNOSIS — R7303 Prediabetes: Secondary | ICD-10-CM | POA: Diagnosis not present

## 2020-09-30 DIAGNOSIS — I10 Essential (primary) hypertension: Secondary | ICD-10-CM | POA: Diagnosis not present

## 2020-09-30 DIAGNOSIS — E7800 Pure hypercholesterolemia, unspecified: Secondary | ICD-10-CM | POA: Diagnosis not present

## 2020-09-30 DIAGNOSIS — N182 Chronic kidney disease, stage 2 (mild): Secondary | ICD-10-CM | POA: Diagnosis not present

## 2020-09-30 DIAGNOSIS — E559 Vitamin D deficiency, unspecified: Secondary | ICD-10-CM | POA: Diagnosis not present

## 2020-09-30 DIAGNOSIS — R17 Unspecified jaundice: Secondary | ICD-10-CM | POA: Diagnosis not present

## 2020-10-05 ENCOUNTER — Other Ambulatory Visit: Payer: Self-pay | Admitting: Gastroenterology

## 2020-10-05 DIAGNOSIS — Z86010 Personal history of colonic polyps: Secondary | ICD-10-CM | POA: Diagnosis not present

## 2020-10-05 DIAGNOSIS — K8500 Idiopathic acute pancreatitis without necrosis or infection: Secondary | ICD-10-CM | POA: Diagnosis not present

## 2020-10-05 DIAGNOSIS — K2270 Barrett's esophagus without dysplasia: Secondary | ICD-10-CM | POA: Diagnosis not present

## 2020-10-05 DIAGNOSIS — K219 Gastro-esophageal reflux disease without esophagitis: Secondary | ICD-10-CM | POA: Diagnosis not present

## 2020-10-13 ENCOUNTER — Other Ambulatory Visit: Payer: Self-pay

## 2020-10-13 DIAGNOSIS — K635 Polyp of colon: Secondary | ICD-10-CM | POA: Diagnosis not present

## 2020-10-13 DIAGNOSIS — K5730 Diverticulosis of large intestine without perforation or abscess without bleeding: Secondary | ICD-10-CM | POA: Diagnosis not present

## 2020-10-13 DIAGNOSIS — Z86010 Personal history of colonic polyps: Secondary | ICD-10-CM | POA: Diagnosis not present

## 2020-10-13 DIAGNOSIS — D123 Benign neoplasm of transverse colon: Secondary | ICD-10-CM | POA: Diagnosis not present

## 2020-10-14 DIAGNOSIS — I10 Essential (primary) hypertension: Secondary | ICD-10-CM | POA: Diagnosis not present

## 2020-10-14 DIAGNOSIS — E7800 Pure hypercholesterolemia, unspecified: Secondary | ICD-10-CM | POA: Diagnosis not present

## 2020-10-14 DIAGNOSIS — N182 Chronic kidney disease, stage 2 (mild): Secondary | ICD-10-CM | POA: Diagnosis not present

## 2020-10-14 DIAGNOSIS — R7303 Prediabetes: Secondary | ICD-10-CM | POA: Diagnosis not present

## 2020-10-16 ENCOUNTER — Encounter (HOSPITAL_COMMUNITY)
Admission: RE | Disposition: A | Discharge status: Home / Self Care | Payer: Self-pay | Source: Home / Self Care | Attending: Gastroenterology

## 2020-10-16 ENCOUNTER — Encounter (HOSPITAL_COMMUNITY): Payer: Self-pay | Admitting: Gastroenterology

## 2020-10-16 ENCOUNTER — Ambulatory Visit (HOSPITAL_COMMUNITY): Payer: Medicare Other | Admitting: Anesthesiology

## 2020-10-16 ENCOUNTER — Other Ambulatory Visit: Payer: Self-pay

## 2020-10-16 ENCOUNTER — Ambulatory Visit (HOSPITAL_COMMUNITY)
Admission: RE | Admit: 2020-10-16 | Discharge: 2020-10-16 | Disposition: A | Discharge status: Home / Self Care | Payer: Medicare Other | Attending: Gastroenterology | Admitting: Gastroenterology

## 2020-10-16 DIAGNOSIS — Z888 Allergy status to other drugs, medicaments and biological substances status: Secondary | ICD-10-CM | POA: Insufficient documentation

## 2020-10-16 DIAGNOSIS — Z8719 Personal history of other diseases of the digestive system: Secondary | ICD-10-CM | POA: Diagnosis not present

## 2020-10-16 DIAGNOSIS — K319 Disease of stomach and duodenum, unspecified: Secondary | ICD-10-CM | POA: Insufficient documentation

## 2020-10-16 DIAGNOSIS — Z79899 Other long term (current) drug therapy: Secondary | ICD-10-CM | POA: Insufficient documentation

## 2020-10-16 DIAGNOSIS — R748 Abnormal levels of other serum enzymes: Secondary | ICD-10-CM | POA: Diagnosis not present

## 2020-10-16 DIAGNOSIS — K2270 Barrett's esophagus without dysplasia: Secondary | ICD-10-CM | POA: Diagnosis not present

## 2020-10-16 DIAGNOSIS — I10 Essential (primary) hypertension: Secondary | ICD-10-CM | POA: Diagnosis not present

## 2020-10-16 DIAGNOSIS — K31A0 Gastric intestinal metaplasia, unspecified: Secondary | ICD-10-CM | POA: Diagnosis not present

## 2020-10-16 DIAGNOSIS — K8500 Idiopathic acute pancreatitis without necrosis or infection: Secondary | ICD-10-CM | POA: Diagnosis not present

## 2020-10-16 DIAGNOSIS — K2990 Gastroduodenitis, unspecified, without bleeding: Secondary | ICD-10-CM | POA: Diagnosis not present

## 2020-10-16 DIAGNOSIS — K3189 Other diseases of stomach and duodenum: Secondary | ICD-10-CM | POA: Diagnosis not present

## 2020-10-16 DIAGNOSIS — Z87891 Personal history of nicotine dependence: Secondary | ICD-10-CM | POA: Diagnosis not present

## 2020-10-16 DIAGNOSIS — K2970 Gastritis, unspecified, without bleeding: Secondary | ICD-10-CM | POA: Diagnosis not present

## 2020-10-16 DIAGNOSIS — Z91041 Radiographic dye allergy status: Secondary | ICD-10-CM | POA: Diagnosis not present

## 2020-10-16 HISTORY — PX: ESOPHAGOGASTRODUODENOSCOPY (EGD) WITH PROPOFOL: SHX5813

## 2020-10-16 HISTORY — PX: BIOPSY: SHX5522

## 2020-10-16 HISTORY — PX: UPPER ESOPHAGEAL ENDOSCOPIC ULTRASOUND (EUS): SHX6562

## 2020-10-16 SURGERY — ESOPHAGOGASTRODUODENOSCOPY (EGD) WITH PROPOFOL
Anesthesia: Monitor Anesthesia Care

## 2020-10-16 MED ORDER — LACTATED RINGERS IV SOLN: INTRAVENOUS | Status: DC | PRN

## 2020-10-16 MED ORDER — PROPOFOL 500 MG/50ML IV EMUL
INTRAVENOUS | Status: DC | PRN
  Administered 2020-10-16: 150 ug/kg/min via INTRAVENOUS

## 2020-10-16 MED ORDER — PROPOFOL 10 MG/ML IV BOLUS
INTRAVENOUS | Status: DC | PRN
  Administered 2020-10-16 (×2): 30 mg via INTRAVENOUS
  Administered 2020-10-16: 20 mg via INTRAVENOUS

## 2020-10-16 MED ORDER — PROPOFOL 500 MG/50ML IV EMUL
INTRAVENOUS | Status: AC
  Filled 2020-10-16: qty 50

## 2020-10-16 MED ORDER — ONDANSETRON HCL 4 MG/2ML IJ SOLN
INTRAMUSCULAR | Status: DC | PRN
  Administered 2020-10-16: 4 mg via INTRAVENOUS

## 2020-10-16 MED ORDER — SODIUM CHLORIDE 0.9 % IV SOLN: INTRAVENOUS | Status: DC

## 2020-10-16 SURGICAL SUPPLY — 14 items

## 2020-10-16 NOTE — Anesthesia Procedure Notes (Signed)
Procedure Name: MAC Date/Time: 10/16/2020 12:21 PM Performed by: Lollie Sails, CRNA Pre-anesthesia Checklist: Patient identified, Emergency Drugs available, Suction available, Patient being monitored and Timeout performed Preoxygenation: POM Mask.

## 2020-10-16 NOTE — Op Note (Signed)
[facility] Patient Name: Roger Mejia Procedure Date: 10/16/2020 MRN: [id number] Attending MD: Carol Ada , MD Date of Birth: 1950/09/22 CSN: [id number] Age: 70 Admit Type: Outpatient Procedure:                Upper EUS Indications:              Elevated lipase Providers:                Carol Ada, MD, Dulcy Fanny, Elspeth Cho                            Tech., Technician, [address]. Zenia Resides CRNA, CRNA Referring MD:              Medicines:                Propofol per Anesthesia Complications:            No immediate complications. Estimated Blood Loss:     Estimated blood loss: none. Procedure:                Pre-Anesthesia Assessment:                           - Prior to the procedure, a History and Physical                            was performed, and patient medications and                            allergies were reviewed. The patient's tolerance of                            previous anesthesia was also reviewed. The risks                            and benefits of the procedure and the sedation                            options and risks were discussed with the patient.                            All questions were answered, and informed consent                            was obtained. Prior Anticoagulants: The patient has                            taken no previous anticoagulant or antiplatelet                            agents. ASA Grade Assessment: II - A patient with                            mild systemic disease. After reviewing the risks  and benefits, the patient was deemed in                            satisfactory condition to undergo the procedure.                           - Sedation was administered by an anesthesia                            professional. Deep sedation was attained.                           After obtaining informed consent, the endoscope was                            passed under direct vision.  Throughout the                            procedure, the patient's blood pressure, pulse, and                            oxygen saturations were monitored continuously. The                            [id number] ([id number] Olympus Linear EUS was                            introduced through the mouth, and advanced to the                            second part of duodenum. The upper EUS was                            accomplished without difficulty. The patient                            tolerated the procedure well. Scope In: Scope Out: Findings:      ENDOSCOPIC FINDING: :      There were esophageal mucosal changes secondary to established       short-segment Barrett's disease present at the gastroesophageal       junction. The maximum longitudinal extent of these mucosal changes was       0.5 cm in length. Biopsies were taken with a cold forceps for histology.      Patchy mild inflammation characterized by erythema was found in the       gastric antrum. Biopsies were taken with a cold forceps for histology.      ENDOSONOGRAPHIC FINDING: :      There was no sign of significant endosonographic abnormality in the       common bile duct, in the common hepatic duct, in the bifurcation of the       common hepatic duct, in the intrahepatic bile duct(s) and in the       gallbladder. The maximum diameter of the ducts were 5 mm. An       unremarkable gallbladder was identified.  There was no sign of significant endosonographic abnormality in the       pancreatic head, genu of the pancreas, pancreatic body and pancreatic       tail. The pancreatic duct measured up to 1 mm in diameter.      There was no evidence of stones in the biliary tract and there was no       evidence of chronic pancreatitis. Endoscopically the patient has a very       limited Barrett's mucosa. On tongue of Barrett's mucosa was identified       and it was nodular, but not suspicious for malignancy. This area was        biopsied. Impression:               - Esophageal mucosal changes secondary to                            established short-segment Barrett's disease.                            Biopsied.                           - Gastritis. Biopsied.                           - There was no sign of significant pathology in the                            common bile duct, in the common hepatic duct, in                            the bifurcation of the common hepatic duct, in the                            intrahepatic bile duct(s) and in the gallbladder.                           - There was no sign of significant pathology in the                            pancreatic head, genu of the pancreas, pancreatic                            body and pancreatic tail. Moderate Sedation:      Not Applicable - Patient had care per Anesthesia. Recommendation:           - Patient has a contact number available for                            emergencies. The signs and symptoms of potential                            delayed complications were discussed with the  patient. Return to normal activities tomorrow.                            Written discharge instructions were provided to the                            patient.                           - Resume regular diet.                           - Await pathology results. Procedure Code(s):        --- Professional ---                           [phone], Esophagogastroduodenoscopy, flexible,                            transoral; with endoscopic ultrasound examination                            limited to the esophagus, stomach or duodenum, and                            adjacent structures                           43239, Esophagogastroduodenoscopy, flexible,                            transoral; with biopsy, single or multiple Diagnosis Code(s):        --- Professional ---                           K22.70, Barrett's esophagus without dysplasia                            K29.70, Gastritis, unspecified, without bleeding                           R74.8, Abnormal levels of other serum enzymes CPT copyright 2019 [employer]. All rights reserved. The codes documented in this report are preliminary and upon coder review may  be revised to meet current compliance requirements. Carol Ada, MD Carol Ada, MD 10/16/2020 1:02:14 PM This report has been signed electronically. Number of Addenda: 0

## 2020-10-16 NOTE — Anesthesia Postprocedure Evaluation (Signed)
Anesthesia Post Note  Patient: Roger Mejia  Procedure(s) Performed: ESOPHAGOGASTRODUODENOSCOPY (EGD) WITH PROPOFOL UPPER ESOPHAGEAL ENDOSCOPIC ULTRASOUND (EUS) BIOPSY     Patient location during evaluation: PACU Anesthesia Type: MAC Level of consciousness: awake and alert Pain management: pain level controlled Vital Signs Assessment: post-procedure vital signs reviewed and stable Respiratory status: spontaneous breathing Cardiovascular status: stable Anesthetic complications: no   No notable events documented.  Last Vitals:  Vitals:   10/16/20 1310 10/16/20 1317  BP: (!) 160/96 (!) 177/103  Pulse: (!) 57 (!) 55  Resp: 16 14  Temp:    SpO2: 98% 100%    Last Pain:  Vitals:   10/16/20 1317  TempSrc:   PainSc: 0-No pain                 Nolon Nations

## 2020-10-16 NOTE — Anesthesia Preprocedure Evaluation (Signed)
Anesthesia Evaluation  Patient identified by MRN, date of birth, ID band Patient awake    Reviewed: Allergy & Precautions, NPO status , Patient's Chart, lab work & pertinent test results  Airway Mallampati: II  TM Distance: >3 FB Neck ROM: Full    Dental no notable dental hx. (+) Dental Advisory Given, Teeth Intact   Pulmonary neg pulmonary ROS, former smoker,    Pulmonary exam normal breath sounds clear to auscultation       Cardiovascular hypertension, Pt. on medications Normal cardiovascular exam Rhythm:Regular Rate:Normal     Neuro/Psych negative neurological ROS     GI/Hepatic negative GI ROS, Neg liver ROS,   Endo/Other  negative endocrine ROS  Renal/GU negative Renal ROS     Musculoskeletal negative musculoskeletal ROS (+)   Abdominal   Peds  Hematology negative hematology ROS (+)   Anesthesia Other Findings   Reproductive/Obstetrics                            Anesthesia Physical Anesthesia Plan  ASA: 2  Anesthesia Plan: MAC   Post-op Pain Management:    Induction: Intravenous  PONV Risk Score and Plan: 1 and Propofol infusion, TIVA, Treatment may vary due to age or medical condition and Ondansetron  Airway Management Planned: Natural Airway  Additional Equipment:   Intra-op Plan:   Post-operative Plan:   Informed Consent: I have reviewed the patients History and Physical, chart, labs and discussed the procedure including the risks, benefits and alternatives for the proposed anesthesia with the patient or authorized representative who has indicated his/her understanding and acceptance.     Dental advisory given  Plan Discussed with: CRNA  Anesthesia Plan Comments:        Anesthesia Quick Evaluation

## 2020-10-16 NOTE — H&P (Signed)
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  It resolved on its own without a hospitalization.  At that time he declined hospitalization and he was treated with pain medications and PO intake as tolerated.  An EUS was recommended, but he did not follow up.  The patient does not know why he did not follow up.  Over this interval time period the patient denies any further issues with his pancreatitis.  He does have a history of Barrett's esophagus as well as intestinal metaplasia in his stomach.   EUS with mucosal biopsies - Barrett's and gastric intestinal metaplasia.   Past Medical History:  Diagnosis Date   Glaucoma    Hypertension     History reviewed. No pertinent surgical history.  History reviewed. No pertinent family history.  Social History:  reports that he quit smoking about 4 years ago. His smoking use included cigarettes. He has a 15.00 pack-year smoking history. He has never used smokeless tobacco. He reports previous alcohol use of about 1.0 standard drink of alcohol per week. He reports that he does not use drugs.  Allergies:  Allergies  Allergen Reactions   Iohexol Hives     Code: HIVES, Desc: 06/23/09  12:20 pt called  complaining of rash lt arm/eye.ct chest2/28/10 no other sympt per dr Darliss Cheney take 40mb benedyl 25 to start. call back ifneeded [email or web address], Onset Date: [id number]   (PATIENT DENIES ALLERGY)   Timolol Other (See Comments) and Swelling    redness    Medications: Scheduled: Continuous:  sodium chloride      No results found for this or any previous visit (from the past 24 hour(s)).   No results found.  ROS:  As stated above in the HPI otherwise negative.  Blood pressure (!) 178/97, pulse 71, temperature 98.3 F (36.8 C), temperature source Oral, resp. rate 14, height 5\' 11"  (1.803 m), weight 86.2 kg, SpO2 98 %.    PE: Gen: NAD, Alert and Oriented HEENT:  [place]/AT, EOMI Neck: Supple, no  LAD Lungs: CTA Bilaterally CV: RRR without M/G/R ABD: Soft, NTND, +BS Ext: No C/C/E  Assessment/Plan: 1) Pancreatitis. 2) Barrett's esophagus. 3) Gastric intestinal metaplasia.  Plan: 1) EUS. 2) Esophageal and gastric biopsies.  Aryn Safran D 10/16/2020, 11:27 AM

## 2020-10-16 NOTE — Discharge Instructions (Signed)

## 2020-10-16 NOTE — Transfer of Care (Signed)
Immediate Anesthesia Transfer of Care Note  Patient: [place]  Procedure(s) Performed: ESOPHAGOGASTRODUODENOSCOPY (EGD) WITH PROPOFOL UPPER ESOPHAGEAL ENDOSCOPIC ULTRASOUND (EUS) BIOPSY  Patient Location: PACU and Endoscopy Unit  Anesthesia Type:MAC  Level of Consciousness: awake, drowsy and responds to stimulation  Airway & Oxygen Therapy: Patient Spontanous Breathing and Patient connected to face mask oxygen  Post-op Assessment: Report given to RN and Post -op Vital signs reviewed and stable  Post vital signs: Reviewed and stable  Last Vitals:  Vitals Value Taken Time  BP    Temp    Pulse 54 10/16/20 1303  Resp 17 10/16/20 1303  SpO2 100 % 10/16/20 1303  Vitals shown include unvalidated device data.  Last Pain:  Vitals:   10/16/20 1057  TempSrc: Oral  PainSc: 0-No pain         Complications: No notable events documented.

## 2020-10-19 LAB — SURGICAL PATHOLOGY

## 2020-10-22 ENCOUNTER — Encounter (HOSPITAL_COMMUNITY): Payer: Self-pay | Admitting: Gastroenterology

## 2020-12-18 DIAGNOSIS — H401112 Primary open-angle glaucoma, right eye, moderate stage: Secondary | ICD-10-CM | POA: Diagnosis not present

## 2020-12-18 DIAGNOSIS — H401123 Primary open-angle glaucoma, left eye, severe stage: Secondary | ICD-10-CM | POA: Diagnosis not present

## 2020-12-18 DIAGNOSIS — Z961 Presence of intraocular lens: Secondary | ICD-10-CM | POA: Diagnosis not present

## 2020-12-18 DIAGNOSIS — H04123 Dry eye syndrome of bilateral lacrimal glands: Secondary | ICD-10-CM | POA: Diagnosis not present

## 2021-02-01 DIAGNOSIS — Z23 Encounter for immunization: Secondary | ICD-10-CM | POA: Diagnosis not present

## 2021-02-18 DIAGNOSIS — H401123 Primary open-angle glaucoma, left eye, severe stage: Secondary | ICD-10-CM | POA: Diagnosis not present

## 2021-02-18 DIAGNOSIS — H401112 Primary open-angle glaucoma, right eye, moderate stage: Secondary | ICD-10-CM | POA: Diagnosis not present

## 2021-02-18 DIAGNOSIS — H04123 Dry eye syndrome of bilateral lacrimal glands: Secondary | ICD-10-CM | POA: Diagnosis not present

## 2021-03-17 DIAGNOSIS — Z1329 Encounter for screening for other suspected endocrine disorder: Secondary | ICD-10-CM | POA: Diagnosis not present

## 2021-03-17 DIAGNOSIS — Z0001 Encounter for general adult medical examination with abnormal findings: Secondary | ICD-10-CM | POA: Diagnosis not present

## 2021-03-17 DIAGNOSIS — E7800 Pure hypercholesterolemia, unspecified: Secondary | ICD-10-CM | POA: Diagnosis not present

## 2021-03-17 DIAGNOSIS — I10 Essential (primary) hypertension: Secondary | ICD-10-CM | POA: Diagnosis not present

## 2021-03-23 DIAGNOSIS — Z0000 Encounter for general adult medical examination without abnormal findings: Secondary | ICD-10-CM | POA: Diagnosis not present

## 2021-03-23 DIAGNOSIS — R7303 Prediabetes: Secondary | ICD-10-CM | POA: Diagnosis not present

## 2021-03-23 DIAGNOSIS — Z23 Encounter for immunization: Secondary | ICD-10-CM | POA: Diagnosis not present

## 2021-03-23 DIAGNOSIS — E7800 Pure hypercholesterolemia, unspecified: Secondary | ICD-10-CM | POA: Diagnosis not present

## 2021-03-23 DIAGNOSIS — I10 Essential (primary) hypertension: Secondary | ICD-10-CM | POA: Diagnosis not present

## 2021-03-23 DIAGNOSIS — N182 Chronic kidney disease, stage 2 (mild): Secondary | ICD-10-CM | POA: Diagnosis not present

## 2021-05-28 DIAGNOSIS — Z961 Presence of intraocular lens: Secondary | ICD-10-CM | POA: Diagnosis not present

## 2021-05-28 DIAGNOSIS — H401112 Primary open-angle glaucoma, right eye, moderate stage: Secondary | ICD-10-CM | POA: Diagnosis not present

## 2021-05-28 DIAGNOSIS — H401123 Primary open-angle glaucoma, left eye, severe stage: Secondary | ICD-10-CM | POA: Diagnosis not present

## 2021-05-28 DIAGNOSIS — H04123 Dry eye syndrome of bilateral lacrimal glands: Secondary | ICD-10-CM | POA: Diagnosis not present

## 2021-09-27 DIAGNOSIS — H401123 Primary open-angle glaucoma, left eye, severe stage: Secondary | ICD-10-CM | POA: Diagnosis not present

## 2021-09-27 DIAGNOSIS — H04123 Dry eye syndrome of bilateral lacrimal glands: Secondary | ICD-10-CM | POA: Diagnosis not present

## 2021-09-27 DIAGNOSIS — H401112 Primary open-angle glaucoma, right eye, moderate stage: Secondary | ICD-10-CM | POA: Diagnosis not present

## 2021-09-27 DIAGNOSIS — H43811 Vitreous degeneration, right eye: Secondary | ICD-10-CM | POA: Diagnosis not present

## 2021-10-07 DIAGNOSIS — K219 Gastro-esophageal reflux disease without esophagitis: Secondary | ICD-10-CM | POA: Diagnosis not present

## 2021-10-07 DIAGNOSIS — I10 Essential (primary) hypertension: Secondary | ICD-10-CM | POA: Diagnosis not present

## 2021-10-07 DIAGNOSIS — R7309 Other abnormal glucose: Secondary | ICD-10-CM | POA: Diagnosis not present

## 2021-10-07 DIAGNOSIS — E782 Mixed hyperlipidemia: Secondary | ICD-10-CM | POA: Diagnosis not present

## 2021-10-07 DIAGNOSIS — H8110 Benign paroxysmal vertigo, unspecified ear: Secondary | ICD-10-CM | POA: Diagnosis not present

## 2021-10-19 DIAGNOSIS — M5451 Vertebrogenic low back pain: Secondary | ICD-10-CM | POA: Diagnosis not present

## 2021-10-29 DIAGNOSIS — M5416 Radiculopathy, lumbar region: Secondary | ICD-10-CM | POA: Diagnosis not present

## 2021-11-08 DIAGNOSIS — M5416 Radiculopathy, lumbar region: Secondary | ICD-10-CM | POA: Diagnosis not present

## 2021-11-22 DIAGNOSIS — H401123 Primary open-angle glaucoma, left eye, severe stage: Secondary | ICD-10-CM | POA: Diagnosis not present

## 2021-11-22 DIAGNOSIS — H401112 Primary open-angle glaucoma, right eye, moderate stage: Secondary | ICD-10-CM | POA: Diagnosis not present

## 2021-11-26 DIAGNOSIS — I10 Essential (primary) hypertension: Secondary | ICD-10-CM | POA: Diagnosis not present

## 2021-11-26 DIAGNOSIS — R7309 Other abnormal glucose: Secondary | ICD-10-CM | POA: Diagnosis not present

## 2021-11-26 DIAGNOSIS — E7801 Familial hypercholesterolemia: Secondary | ICD-10-CM | POA: Diagnosis not present

## 2021-12-03 ENCOUNTER — Other Ambulatory Visit: Payer: Self-pay | Admitting: Internal Medicine

## 2021-12-03 DIAGNOSIS — I10 Essential (primary) hypertension: Secondary | ICD-10-CM

## 2021-12-03 DIAGNOSIS — R7309 Other abnormal glucose: Secondary | ICD-10-CM | POA: Diagnosis not present

## 2021-12-03 DIAGNOSIS — K219 Gastro-esophageal reflux disease without esophagitis: Secondary | ICD-10-CM | POA: Diagnosis not present

## 2021-12-03 DIAGNOSIS — E782 Mixed hyperlipidemia: Secondary | ICD-10-CM | POA: Diagnosis not present

## 2022-02-14 ENCOUNTER — Other Ambulatory Visit: Payer: Medicare Other

## 2022-02-23 ENCOUNTER — Ambulatory Visit
Admission: RE | Admit: 2022-02-23 | Discharge: 2022-02-23 | Disposition: A | Discharge status: Home / Self Care | Payer: Medicare Other | Source: Ambulatory Visit | Attending: Internal Medicine | Admitting: Internal Medicine

## 2022-02-23 DIAGNOSIS — H401112 Primary open-angle glaucoma, right eye, moderate stage: Secondary | ICD-10-CM | POA: Diagnosis not present

## 2022-02-23 DIAGNOSIS — H401123 Primary open-angle glaucoma, left eye, severe stage: Secondary | ICD-10-CM | POA: Diagnosis not present

## 2022-02-23 DIAGNOSIS — I700 Atherosclerosis of aorta: Secondary | ICD-10-CM | POA: Diagnosis not present

## 2022-02-23 DIAGNOSIS — Z23 Encounter for immunization: Secondary | ICD-10-CM | POA: Diagnosis not present

## 2022-02-23 DIAGNOSIS — H04123 Dry eye syndrome of bilateral lacrimal glands: Secondary | ICD-10-CM | POA: Diagnosis not present

## 2022-02-23 DIAGNOSIS — I10 Essential (primary) hypertension: Secondary | ICD-10-CM

## 2022-03-30 ENCOUNTER — Other Ambulatory Visit: Payer: Medicare Other

## 2022-04-06 DIAGNOSIS — I2510 Atherosclerotic heart disease of native coronary artery without angina pectoris: Secondary | ICD-10-CM | POA: Diagnosis not present

## 2022-04-06 DIAGNOSIS — R931 Abnormal findings on diagnostic imaging of heart and coronary circulation: Secondary | ICD-10-CM | POA: Diagnosis not present

## 2022-04-11 ENCOUNTER — Other Ambulatory Visit: Payer: Medicare Other

## 2022-05-18 DIAGNOSIS — I2510 Atherosclerotic heart disease of native coronary artery without angina pectoris: Secondary | ICD-10-CM | POA: Diagnosis not present

## 2022-07-04 DIAGNOSIS — H401112 Primary open-angle glaucoma, right eye, moderate stage: Secondary | ICD-10-CM | POA: Diagnosis not present

## 2022-07-04 DIAGNOSIS — H401123 Primary open-angle glaucoma, left eye, severe stage: Secondary | ICD-10-CM | POA: Diagnosis not present

## 2022-07-04 DIAGNOSIS — H04123 Dry eye syndrome of bilateral lacrimal glands: Secondary | ICD-10-CM | POA: Diagnosis not present

## 2022-08-08 DIAGNOSIS — H401123 Primary open-angle glaucoma, left eye, severe stage: Secondary | ICD-10-CM | POA: Diagnosis not present

## 2022-08-08 DIAGNOSIS — H401112 Primary open-angle glaucoma, right eye, moderate stage: Secondary | ICD-10-CM | POA: Diagnosis not present

## 2022-10-10 DIAGNOSIS — H401123 Primary open-angle glaucoma, left eye, severe stage: Secondary | ICD-10-CM | POA: Diagnosis not present

## 2022-10-10 DIAGNOSIS — H401112 Primary open-angle glaucoma, right eye, moderate stage: Secondary | ICD-10-CM | POA: Diagnosis not present

## 2022-12-05 DIAGNOSIS — R7309 Other abnormal glucose: Secondary | ICD-10-CM | POA: Diagnosis not present

## 2022-12-05 DIAGNOSIS — I10 Essential (primary) hypertension: Secondary | ICD-10-CM | POA: Diagnosis not present

## 2022-12-08 DIAGNOSIS — R931 Abnormal findings on diagnostic imaging of heart and coronary circulation: Secondary | ICD-10-CM | POA: Diagnosis not present

## 2022-12-08 DIAGNOSIS — I10 Essential (primary) hypertension: Secondary | ICD-10-CM | POA: Diagnosis not present

## 2022-12-08 DIAGNOSIS — K219 Gastro-esophageal reflux disease without esophagitis: Secondary | ICD-10-CM | POA: Diagnosis not present

## 2022-12-08 DIAGNOSIS — I2510 Atherosclerotic heart disease of native coronary artery without angina pectoris: Secondary | ICD-10-CM | POA: Diagnosis not present

## 2022-12-08 DIAGNOSIS — Z0000 Encounter for general adult medical examination without abnormal findings: Secondary | ICD-10-CM | POA: Diagnosis not present

## 2022-12-08 DIAGNOSIS — R7309 Other abnormal glucose: Secondary | ICD-10-CM | POA: Diagnosis not present

## 2022-12-22 DIAGNOSIS — I2510 Atherosclerotic heart disease of native coronary artery without angina pectoris: Secondary | ICD-10-CM | POA: Diagnosis not present

## 2023-01-11 DIAGNOSIS — H401112 Primary open-angle glaucoma, right eye, moderate stage: Secondary | ICD-10-CM | POA: Diagnosis not present

## 2023-01-11 DIAGNOSIS — H04123 Dry eye syndrome of bilateral lacrimal glands: Secondary | ICD-10-CM | POA: Diagnosis not present

## 2023-01-11 DIAGNOSIS — H401123 Primary open-angle glaucoma, left eye, severe stage: Secondary | ICD-10-CM | POA: Diagnosis not present

## 2023-02-13 DIAGNOSIS — Z23 Encounter for immunization: Secondary | ICD-10-CM | POA: Diagnosis not present

## 2023-04-13 DIAGNOSIS — Z961 Presence of intraocular lens: Secondary | ICD-10-CM | POA: Diagnosis not present

## 2023-04-13 DIAGNOSIS — H04123 Dry eye syndrome of bilateral lacrimal glands: Secondary | ICD-10-CM | POA: Diagnosis not present

## 2023-04-13 DIAGNOSIS — H401123 Primary open-angle glaucoma, left eye, severe stage: Secondary | ICD-10-CM | POA: Diagnosis not present

## 2023-04-13 DIAGNOSIS — H43811 Vitreous degeneration, right eye: Secondary | ICD-10-CM | POA: Diagnosis not present

## 2023-04-13 DIAGNOSIS — H401112 Primary open-angle glaucoma, right eye, moderate stage: Secondary | ICD-10-CM | POA: Diagnosis not present

## 2023-05-26 DIAGNOSIS — M25561 Pain in right knee: Secondary | ICD-10-CM | POA: Diagnosis not present

## 2023-07-24 DIAGNOSIS — H401112 Primary open-angle glaucoma, right eye, moderate stage: Secondary | ICD-10-CM | POA: Diagnosis not present

## 2023-07-24 DIAGNOSIS — H401123 Primary open-angle glaucoma, left eye, severe stage: Secondary | ICD-10-CM | POA: Diagnosis not present

## 2024-01-04 DIAGNOSIS — H401123 Primary open-angle glaucoma, left eye, severe stage: Secondary | ICD-10-CM | POA: Diagnosis not present

## 2024-01-04 DIAGNOSIS — H401112 Primary open-angle glaucoma, right eye, moderate stage: Secondary | ICD-10-CM | POA: Diagnosis not present

## 2024-01-19 DIAGNOSIS — R7309 Other abnormal glucose: Secondary | ICD-10-CM | POA: Diagnosis not present

## 2024-01-19 DIAGNOSIS — Z125 Encounter for screening for malignant neoplasm of prostate: Secondary | ICD-10-CM | POA: Diagnosis not present

## 2024-01-19 DIAGNOSIS — I10 Essential (primary) hypertension: Secondary | ICD-10-CM | POA: Diagnosis not present

## 2024-01-19 DIAGNOSIS — E7800 Pure hypercholesterolemia, unspecified: Secondary | ICD-10-CM | POA: Diagnosis not present

## 2024-01-31 DIAGNOSIS — K219 Gastro-esophageal reflux disease without esophagitis: Secondary | ICD-10-CM | POA: Diagnosis not present

## 2024-01-31 DIAGNOSIS — I2510 Atherosclerotic heart disease of native coronary artery without angina pectoris: Secondary | ICD-10-CM | POA: Diagnosis not present

## 2024-01-31 DIAGNOSIS — I340 Nonrheumatic mitral (valve) insufficiency: Secondary | ICD-10-CM | POA: Diagnosis not present

## 2024-01-31 DIAGNOSIS — E1169 Type 2 diabetes mellitus with other specified complication: Secondary | ICD-10-CM | POA: Diagnosis not present

## 2024-01-31 DIAGNOSIS — Z0000 Encounter for general adult medical examination without abnormal findings: Secondary | ICD-10-CM | POA: Diagnosis not present

## 2024-01-31 DIAGNOSIS — E782 Mixed hyperlipidemia: Secondary | ICD-10-CM | POA: Diagnosis not present

## 2024-01-31 DIAGNOSIS — I10 Essential (primary) hypertension: Secondary | ICD-10-CM | POA: Diagnosis not present

## 2024-01-31 DIAGNOSIS — Z23 Encounter for immunization: Secondary | ICD-10-CM | POA: Diagnosis not present

## 2024-03-07 DIAGNOSIS — E1169 Type 2 diabetes mellitus with other specified complication: Secondary | ICD-10-CM | POA: Diagnosis not present

## 2024-03-07 DIAGNOSIS — I10 Essential (primary) hypertension: Secondary | ICD-10-CM | POA: Diagnosis not present

## 2024-03-07 DIAGNOSIS — E7800 Pure hypercholesterolemia, unspecified: Secondary | ICD-10-CM | POA: Diagnosis not present

## 2024-03-07 DIAGNOSIS — R931 Abnormal findings on diagnostic imaging of heart and coronary circulation: Secondary | ICD-10-CM | POA: Diagnosis not present
